# Patient Record
Sex: Male | Born: 1955 | Race: White | Hispanic: No | Marital: Married | Smoking: Former smoker
Health system: Southern US, Community
[De-identification: ages and names within clinical notes are randomized; demographics above are authoritative.]

## PROBLEM LIST (undated history)

## (undated) DIAGNOSIS — N189 Chronic kidney disease, unspecified: Secondary | ICD-10-CM

## (undated) DIAGNOSIS — I10 Essential (primary) hypertension: Secondary | ICD-10-CM

## (undated) DIAGNOSIS — E785 Hyperlipidemia, unspecified: Secondary | ICD-10-CM

## (undated) DIAGNOSIS — E119 Type 2 diabetes mellitus without complications: Secondary | ICD-10-CM

## (undated) DIAGNOSIS — M1990 Unspecified osteoarthritis, unspecified site: Secondary | ICD-10-CM

## (undated) HISTORY — DX: Essential (primary) hypertension: I10

## (undated) HISTORY — PX: APPENDECTOMY: SHX54

## (undated) HISTORY — PX: COLONOSCOPY: SHX174

## (undated) HISTORY — DX: Type 2 diabetes mellitus without complications: E11.9

## (undated) HISTORY — PX: TRIGGER FINGER RELEASE: SHX641

---

## 2005-04-29 ENCOUNTER — Ambulatory Visit: Payer: Self-pay | Admitting: Family Medicine

## 2011-03-08 ENCOUNTER — Ambulatory Visit: Payer: Self-pay | Admitting: Family Medicine

## 2012-05-24 ENCOUNTER — Ambulatory Visit: Payer: Self-pay | Admitting: Family Medicine

## 2012-05-31 ENCOUNTER — Ambulatory Visit: Payer: Self-pay | Admitting: Family Medicine

## 2013-09-03 DIAGNOSIS — E785 Hyperlipidemia, unspecified: Secondary | ICD-10-CM | POA: Insufficient documentation

## 2014-03-15 IMAGING — US US SOFT TISSUE EXCLUDE HEAD/NECK
1 series · 14 of 25 positions shown · non-contrast
Comparison: none

REASON FOR EXAM: Small knot in umbilicus and splenomegaly ATTN SPLEEN
COMMENTS:

[Series 1: us soft tissue exclude head/neck · 0.08mm/px · 14 of 27 slices shown]
[im 1/27]
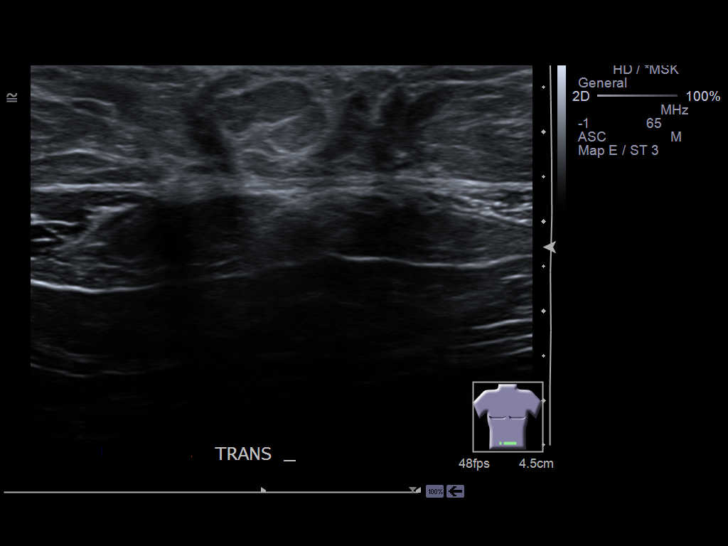
[im 3/27]
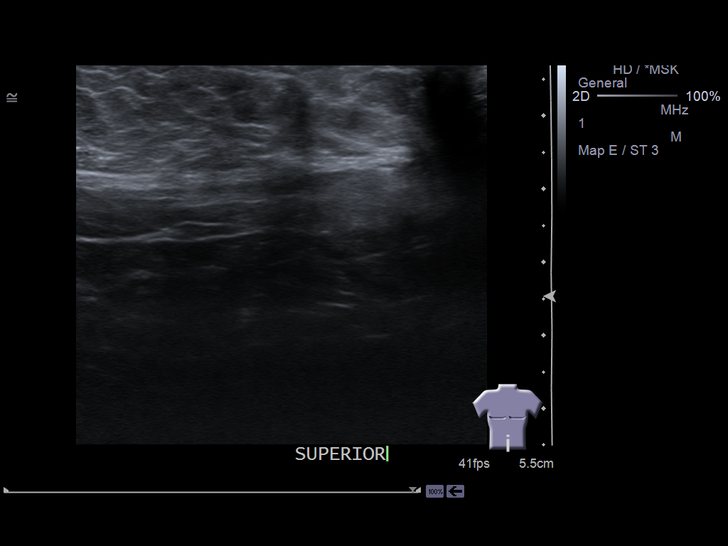
[im 5/27]
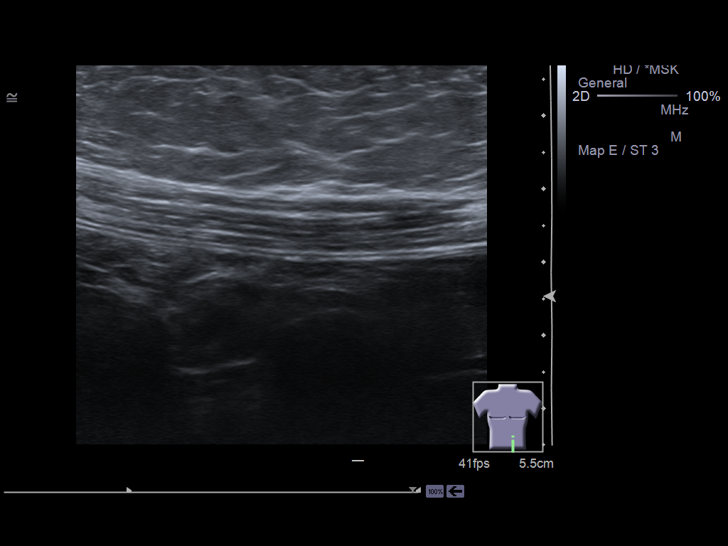
[im 7/27]
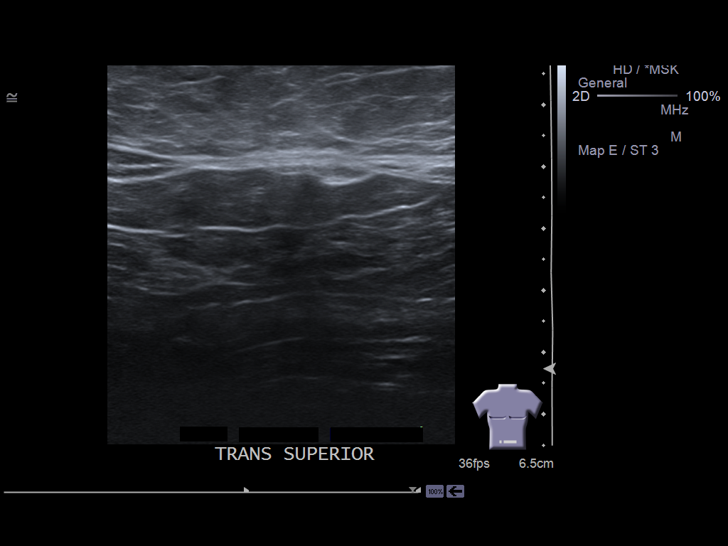
[im 9/27]
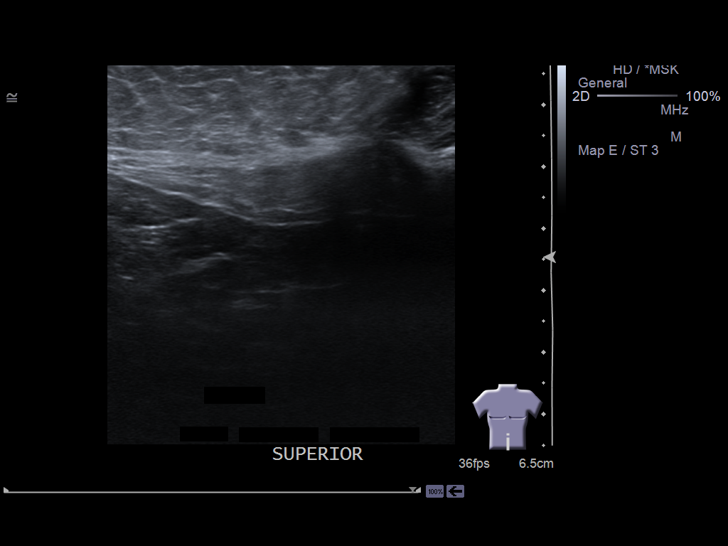
[im 10/27]
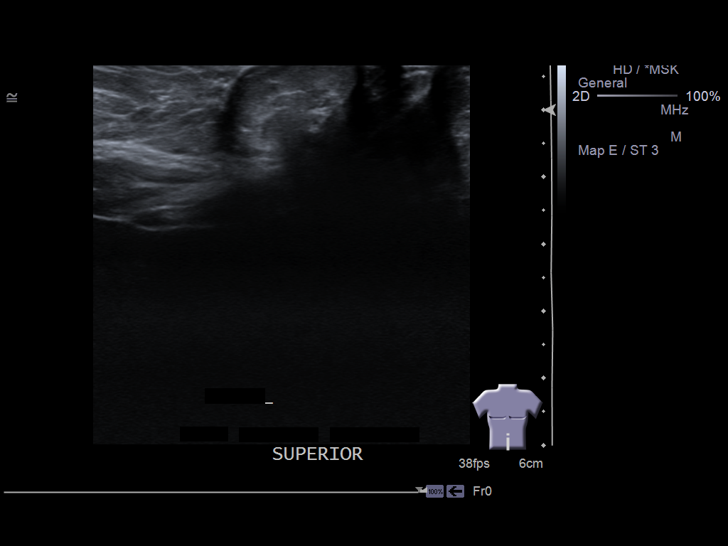
[im 12/27]
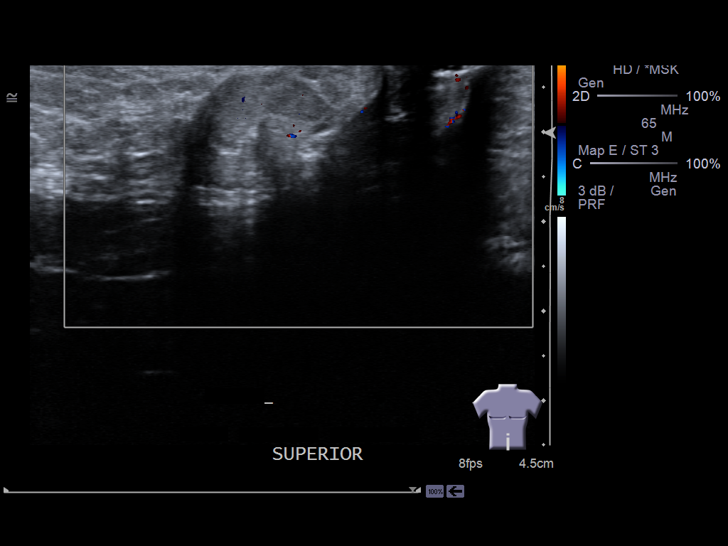
[im 15/27]
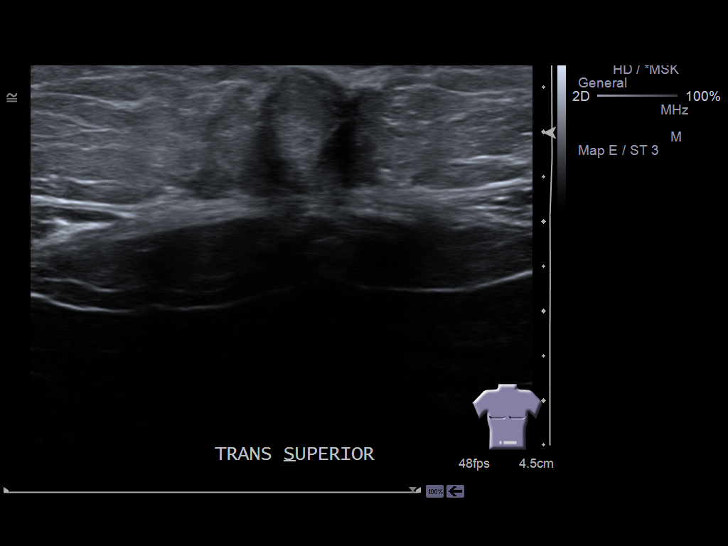
[im 17/27]
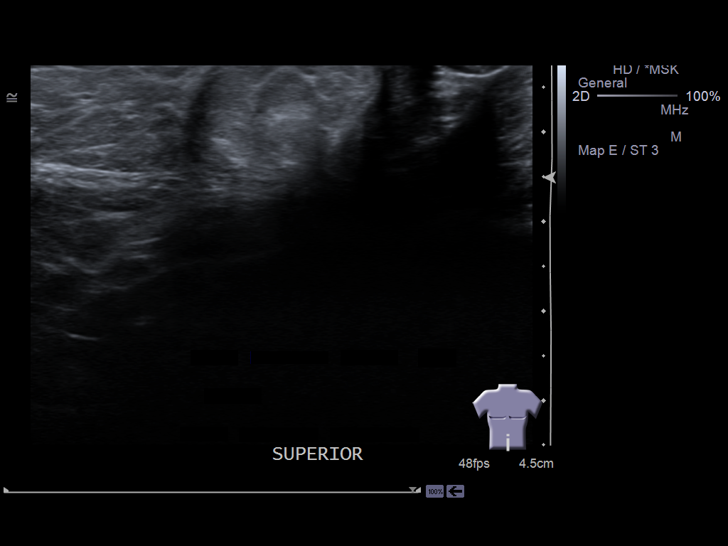
[im 18/27]
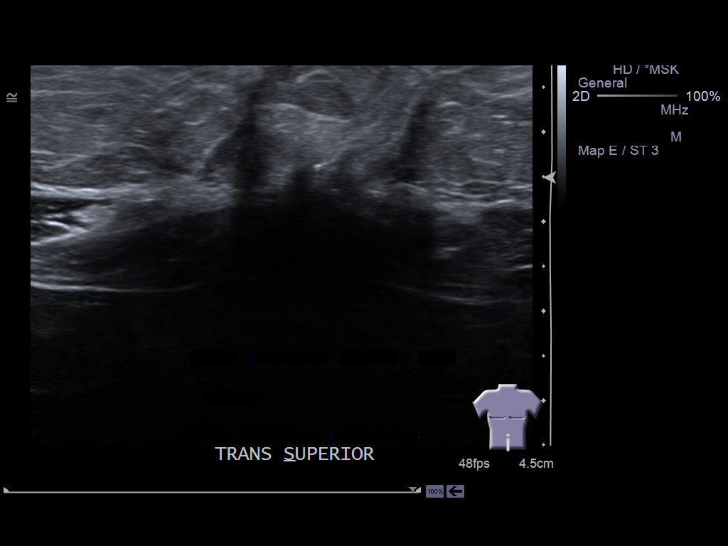
[im 20/27]
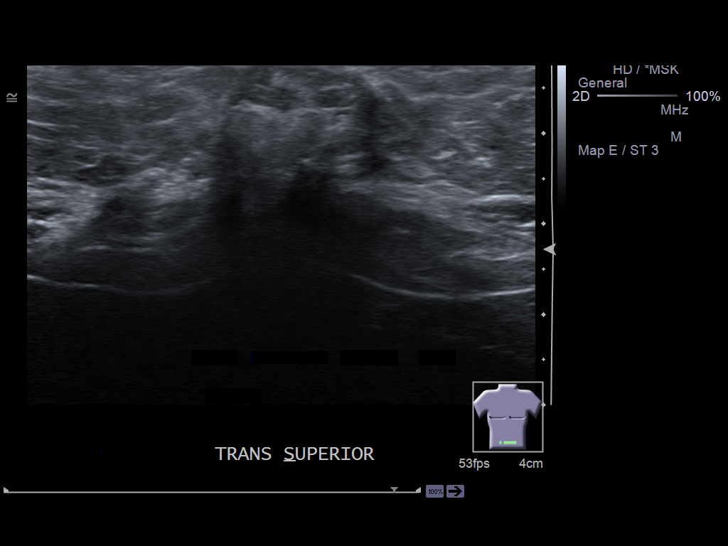
[im 22/27]
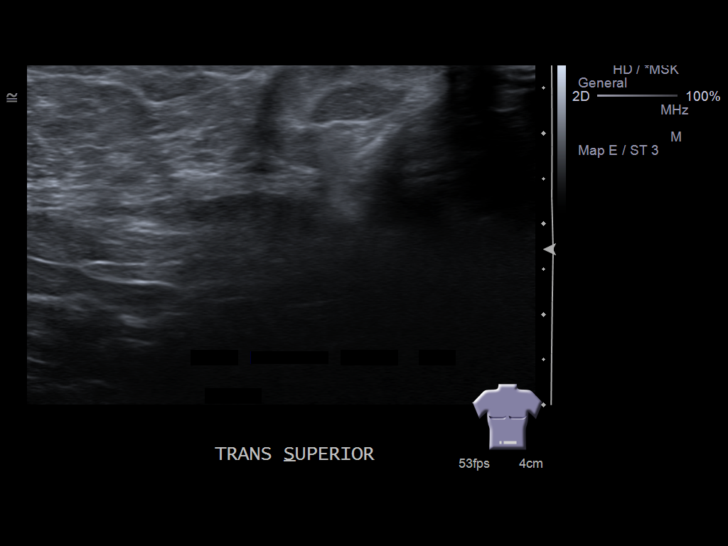
[im 24/27]
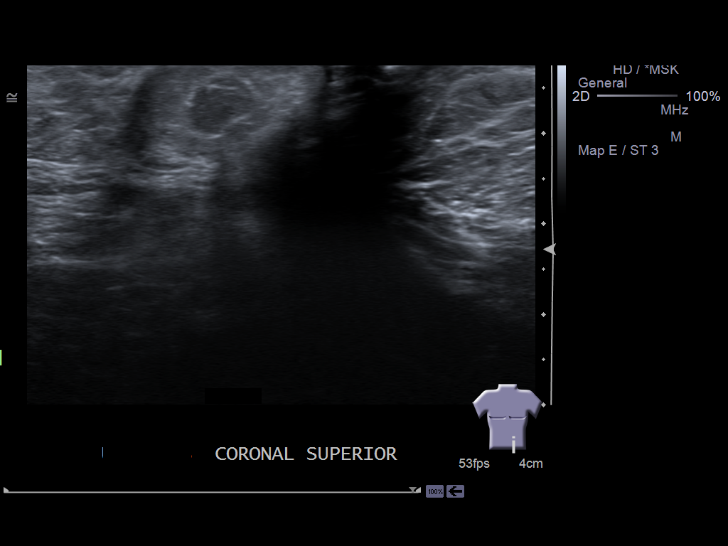
[im 27/27]
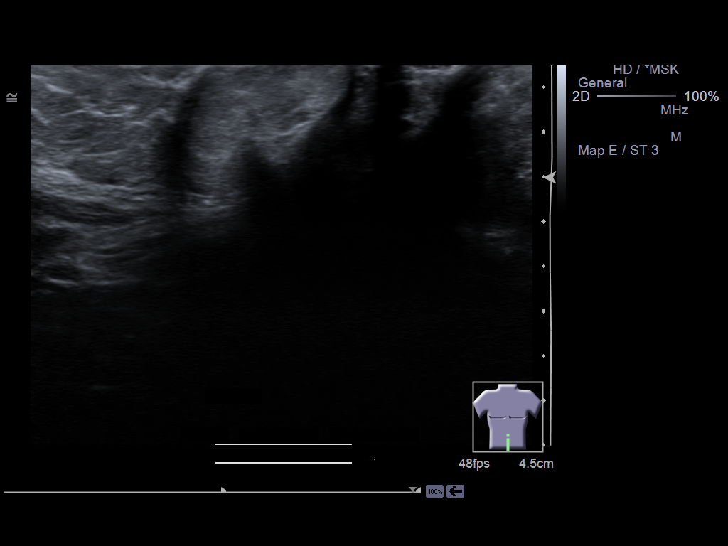

[14 of 25 positions shown; findings below may reference images not displayed]

PROCEDURE:     KATRAGADDA - KATRAGADDA SOFT TISSUE NOT HEAD/NECK  - May 24, 2012 [DATE]

RESULT:     Ultrasound was performed directed to the periumbilical region
where a tiny palpable nodule is demonstrated.

There is a small area of slightly hypoechoic echotexture adjacent to the
umbilicus measuring 2.2 x 1.8 x 2.4 cm. This may reflect a hernia but it is
nondiagnostic on the study.
IMPRESSION: There may be a periumbilical hernia. Further evaluation
with abdominal CT scanning is recommended.

[REDACTED]

## 2014-03-22 IMAGING — CT CT ABDOMEN W/ CM
1 of 2 series · 14 of 32 positions shown, 19 images · IV contrast (isovue)
Comparison: none

REASON FOR EXAM: abd pain periumbilical    diabetic
COMMENTS:

PROCEDURE:     KCT - KCT ABDOMEN STANDARD W  - May 31, 2012  [DATE]
RESULT:     Comparison: None
TECHNIQUE: Multiple axial images of the abdomen were performed from the lung
bases to the iliac crests, with p.o. contrast and with 100 mL of Isovue 370
intravenous contrast.

[Series 2: abd 3mm w 3.0 i40f 3 · axial · 0.92mm/px · z∈[-331,-61]mm · 14 of 100 slices shown, 19 images]
[im 5/100  soft-tissue]
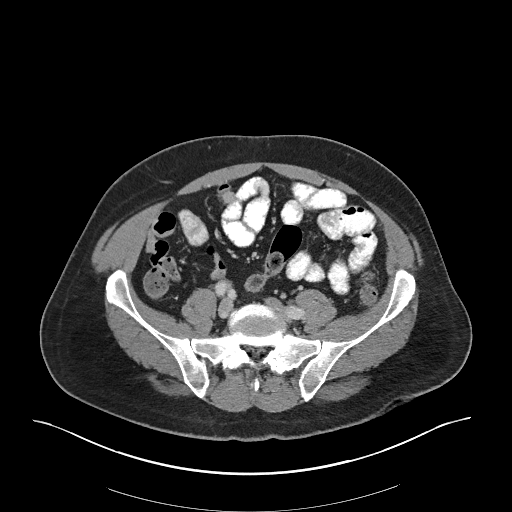
[im 5/100  bone]
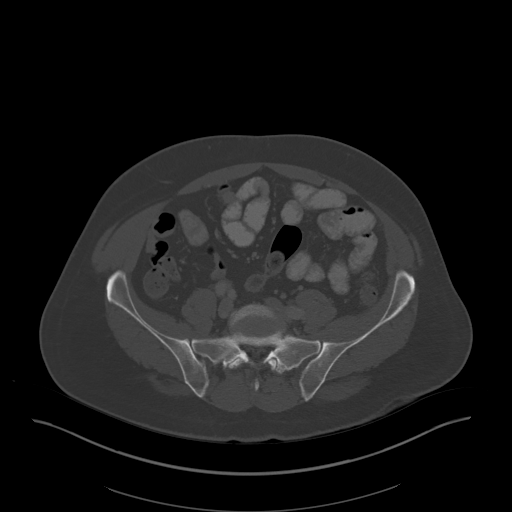
[im 15/100  soft-tissue]
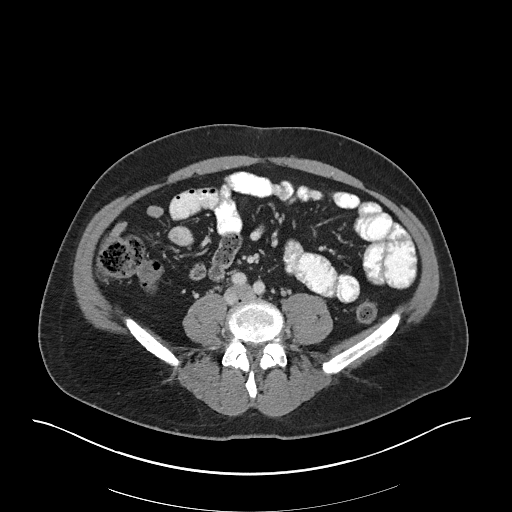
[im 19/100  soft-tissue]
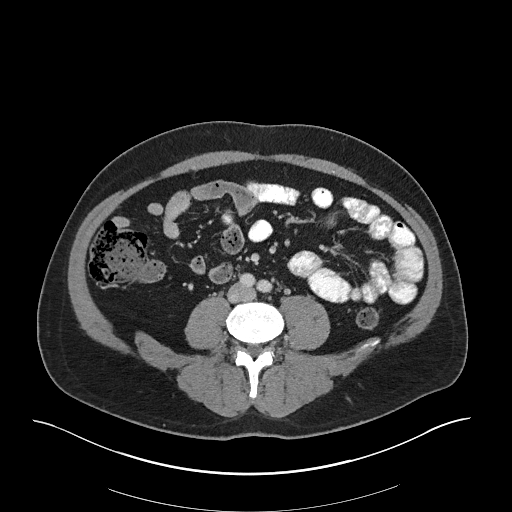
[im 29/100  soft-tissue]
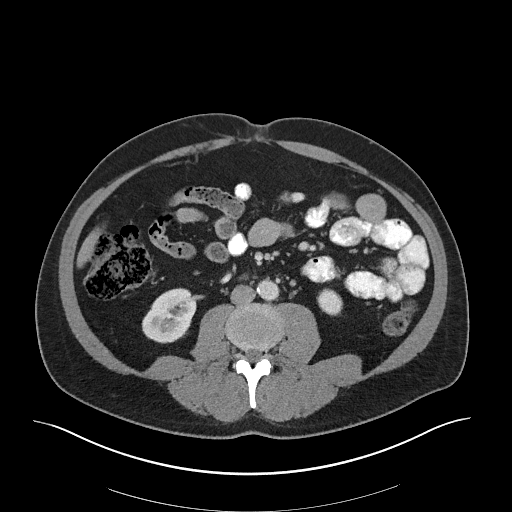
[im 34/100  soft-tissue]
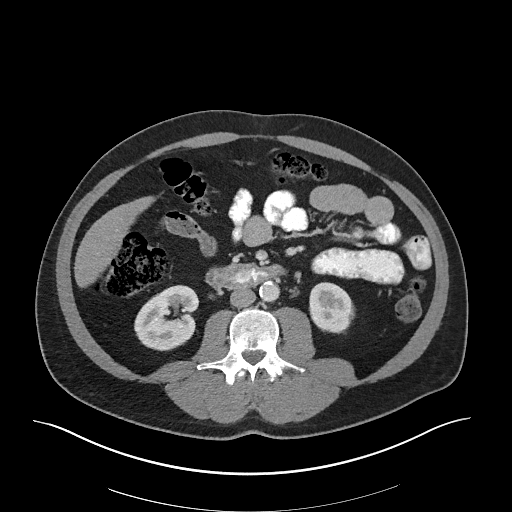
[im 43/100  soft-tissue]
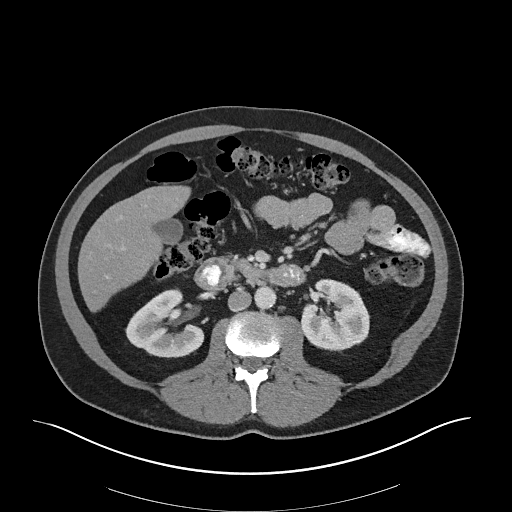
[im 52/100  soft-tissue]
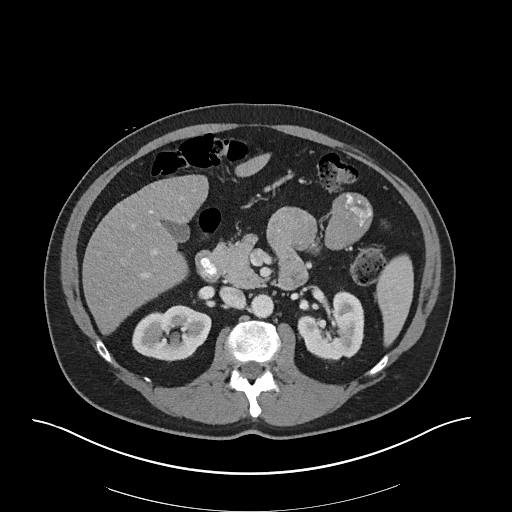
[im 57/100  soft-tissue]
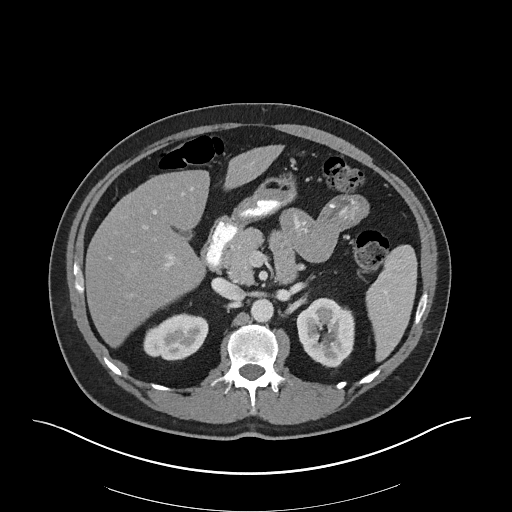
[im 67/100  soft-tissue]
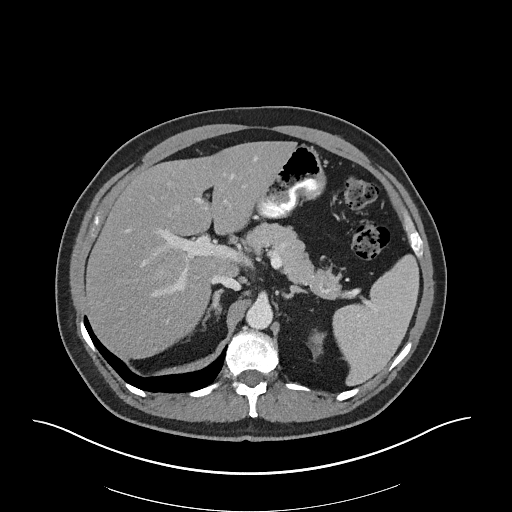
[im 67/100  bone]
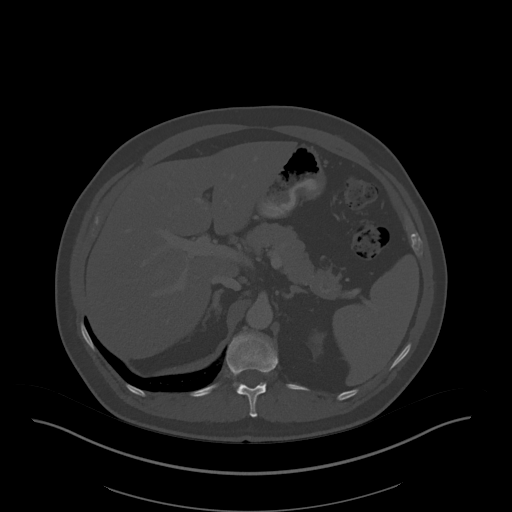
[im 71/100  soft-tissue]
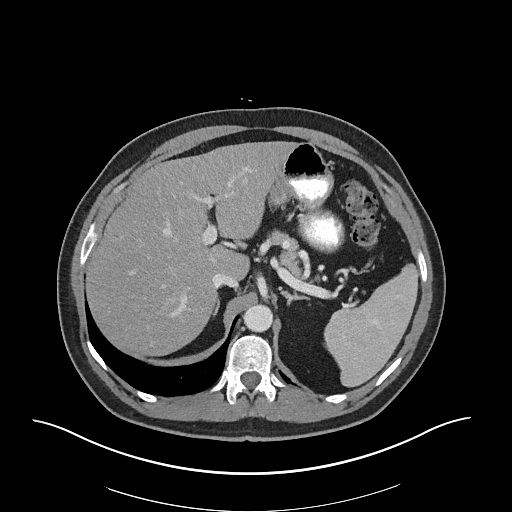
[im 81/100  soft-tissue]
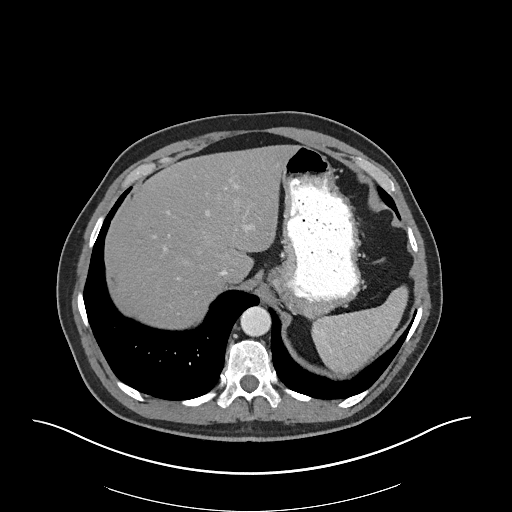
[im 81/100  lung]
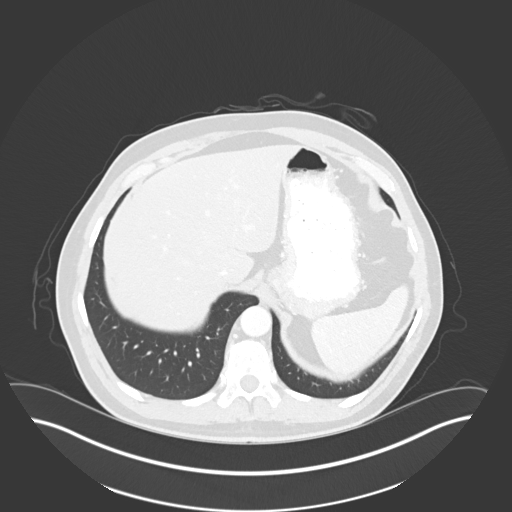
[im 85/100  soft-tissue]
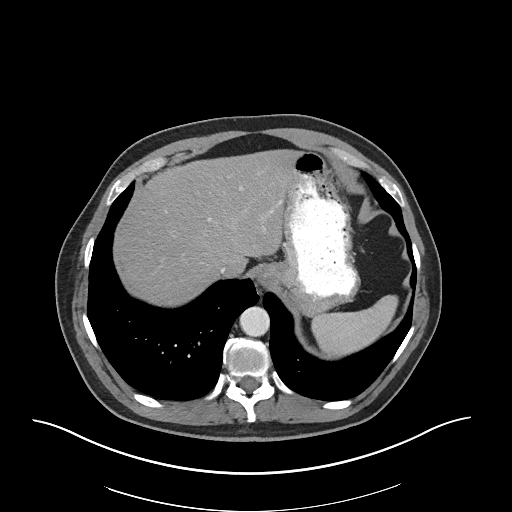
[im 85/100  lung]
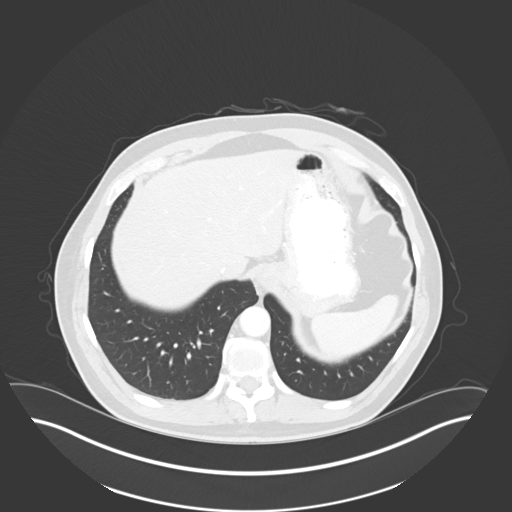
[im 90/100  lung]
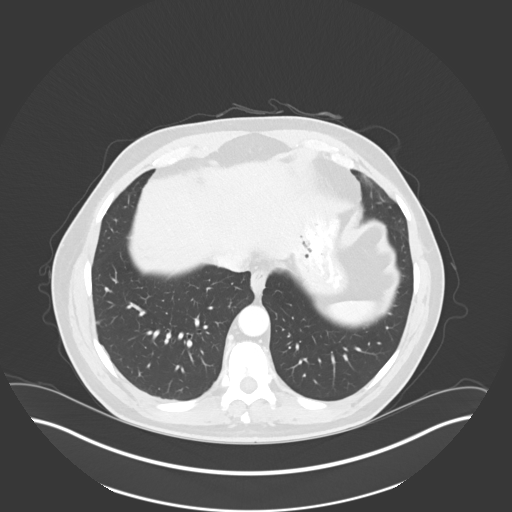
[im 95/100  soft-tissue]
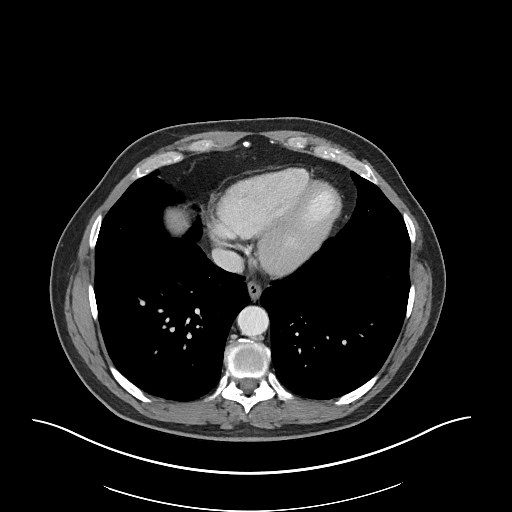
[im 95/100  lung]
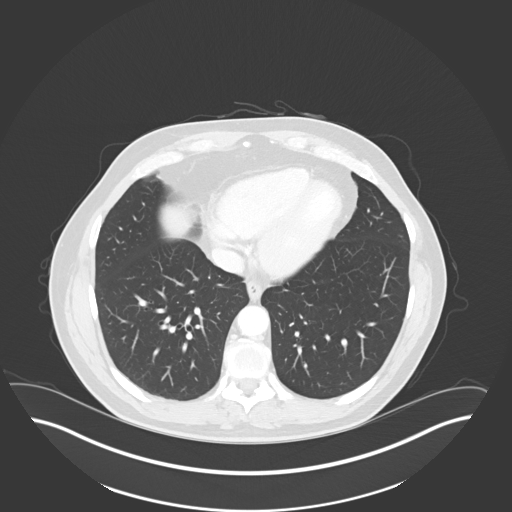

[14 of 32 positions shown; findings below may reference images not displayed]

FINDINGS: There is a 4 mm subpleural nodule in the right lower lobe. Small round
infrahilar density at the superior aspect of the study measures water
density and likely related to the pericardial recess about the inferior
pulmonary veins, incompletely visualized.

The liver, spleen, adrenals, and pancreas are unremarkable. Gallstones are
seen within the gallbladder. The kidneys enhance normally. The visualized
small and large bowel are normal in caliber. There is a small fat-containing
periumbilical hernia.

No aggressive lytic or sclerotic osseous lesions are identified.
IMPRESSION: 1. No acute findings in the abdomen.
2. Cholelithiasis.
2. Indeterminate 4 mm nodule in the right lower lobe. If the patient is at
low risk for lung cancer, no further follow-up is recommended.  If the
patient is at high risk for lung cancer, recommend 12 month follow-up
noncontrast chest CT.

## 2015-03-28 DIAGNOSIS — I10 Essential (primary) hypertension: Secondary | ICD-10-CM | POA: Insufficient documentation

## 2019-05-25 DIAGNOSIS — N289 Disorder of kidney and ureter, unspecified: Secondary | ICD-10-CM | POA: Insufficient documentation

## 2019-05-25 DIAGNOSIS — K760 Fatty (change of) liver, not elsewhere classified: Secondary | ICD-10-CM | POA: Insufficient documentation

## 2019-07-05 DIAGNOSIS — L719 Rosacea, unspecified: Secondary | ICD-10-CM

## 2019-07-05 HISTORY — DX: Rosacea, unspecified: L71.9

## 2019-10-02 ENCOUNTER — Telehealth: Payer: Self-pay | Admitting: General Surgery

## 2019-10-02 ENCOUNTER — Encounter: Payer: Self-pay | Admitting: General Surgery

## 2019-10-02 ENCOUNTER — Other Ambulatory Visit: Payer: Self-pay

## 2019-10-02 ENCOUNTER — Ambulatory Visit (INDEPENDENT_AMBULATORY_CARE_PROVIDER_SITE_OTHER): Payer: No Typology Code available for payment source | Admitting: General Surgery

## 2019-10-02 VITALS — BP 178/98 | HR 59 | Temp 98.5°F | Resp 14 | Ht 70.0 in | Wt 195.2 lb

## 2019-10-02 DIAGNOSIS — K429 Umbilical hernia without obstruction or gangrene: Secondary | ICD-10-CM | POA: Diagnosis not present

## 2019-10-02 DIAGNOSIS — R519 Headache, unspecified: Secondary | ICD-10-CM | POA: Insufficient documentation

## 2019-10-02 DIAGNOSIS — K649 Unspecified hemorrhoids: Secondary | ICD-10-CM | POA: Insufficient documentation

## 2019-10-02 NOTE — Telephone Encounter (Signed)
Left message for patient to call so that we can discuss some surgical dates.

## 2019-10-02 NOTE — Patient Instructions (Signed)
Our surgery scheduler will call you within 24-48 hours to schedule your surgery. Please have the Blue surgery sheet available when speaking with her.   Umbilical Hernia, Adult  A hernia is a bulge of tissue that pushes through an opening between muscles. An umbilical hernia happens in the abdomen, near the belly button (umbilicus). The hernia may contain tissues from the small intestine, large intestine, or fatty tissue covering the intestines (omentum). Umbilical hernias in adults tend to get worse over time, and they require surgical treatment. There are several types of umbilical hernias. You may have:  A hernia located just above or below the umbilicus (indirect hernia). This is the most common type of umbilical hernia in adults.  A hernia that forms through an opening formed by the umbilicus (direct hernia).  A hernia that comes and goes (reducible hernia). A reducible hernia may be visible only when you strain, lift something heavy, or cough. This type of hernia can be pushed back into the abdomen (reduced).  A hernia that traps abdominal tissue inside the hernia (incarcerated hernia). This type of hernia cannot be reduced.  A hernia that cuts off blood flow to the tissues inside the hernia (strangulated hernia). The tissues can start to die if this happens. This type of hernia requires emergency treatment. What are the causes? An umbilical hernia happens when tissue inside the abdomen presses on a weak area of the abdominal muscles. What increases the risk? You may have a greater risk of this condition if you:  Are obese.  Have had several pregnancies.  Have a buildup of fluid inside your abdomen (ascites).  Have had surgery that weakens the abdominal muscles. What are the signs or symptoms? The main symptom of this condition is a painless bulge at or near the belly button. A reducible hernia may be visible only when you strain, lift something heavy, or cough. Other symptoms may  include:  Dull pain.  A feeling of pressure. Symptoms of a strangulated hernia may include:  Pain that gets increasingly worse.  Nausea and vomiting.  Pain when pressing on the hernia.  Skin over the hernia becoming red or purple.  Constipation.  Blood in the stool. How is this diagnosed? This condition may be diagnosed based on:  A physical exam. You may be asked to cough or strain while standing. These actions increase the pressure inside your abdomen and force the hernia through the opening in your muscles. Your health care provider may try to reduce the hernia by pressing on it.  Your symptoms and medical history. How is this treated? Surgery is the only treatment for an umbilical hernia. Surgery for a strangulated hernia is done as soon as possible. If you have a small hernia that is not incarcerated, you may need to lose weight before having surgery. Follow these instructions at home:  Lose weight, if told by your health care provider.  Do not try to push the hernia back in.  Watch your hernia for any changes in color or size. Tell your health care provider if any changes occur.  You may need to avoid activities that increase pressure on your hernia.  Do not lift anything that is heavier than 10 lb (4.5 kg) until your health care provider says that this is safe.  Take over-the-counter and prescription medicines only as told by your health care provider.  Keep all follow-up visits as told by your health care provider. This is important. Contact a health care provider if:    Your hernia gets larger.  Your hernia becomes painful. Get help right away if:  You develop sudden, severe pain near the area of your hernia.  You have pain as well as nausea or vomiting.  You have pain and the skin over your hernia changes color.  You develop a fever. This information is not intended to replace advice given to you by your health care provider. Make sure you discuss any  questions you have with your health care provider. Document Revised: 02/02/2017 Document Reviewed: 06/21/2016 Elsevier Patient Education  2020 [employer]  

## 2019-10-02 NOTE — Progress Notes (Signed)
Patient ID: Brandon Fry, male   DOB: [birth year], 64 y.o.   MRN: [id number]  Chief Complaint  Patient presents with  . New Patient (Initial Visit)    umbilical hernia    HPI Brandon Fry is a 64 y.o. male.   He has been referred by his primary care provider, Dr. Estelle Grumbles, for evaluation of an umbilical hernia.  Brandon Fry states that he has noticed a bulge in his umbilicus for several years, but only recently he began to experience some discomfort.  He says that it is not particularly painful, just uncomfortable.  It is worse as he progresses throughout the day and seems to resolve overnight.  The discomfort is localized to the umbilicus and does not radiate anywhere.  He denies any nausea or vomiting.  No constipation, obstipation, or other symptoms concerning for potential obstruction or bowel involvement.  Due to the discomfort, he is interested in surgical repair.  He has a history of open appendectomy as a child for what he describes as "almost perforated" appendicitis.  This was performed through a lower midline incision, rather than a typical McBurney's.   Past Medical History:  Diagnosis Date  . Diabetes mellitus without complication (HCC)   . Hypertension     Past Surgical History:  Procedure Laterality Date  . APPENDECTOMY      Family History  Problem Relation Age of Onset  . Pulmonary embolism Father     Social History Social History   Tobacco Use  . Smoking status: Former Smoker    Quit date: 1990    Years since quitting: 31.7  . Smokeless tobacco: Never Used  Substance Use Topics  . Alcohol use: Not on file    Comment: a few drinks  . Drug use: Never    Allergies  Allergen Reactions  . Sulfa Antibiotics Rash    Current Outpatient Medications  Medication Sig Dispense Refill  . Dulaglutide (TRULICITY) 1.5 MG/0.5ML SOPN Inject into the skin.    Marland Kitchen ezetimibe-simvastatin (VYTORIN) 10-10 MG tablet Take 1 tablet by mouth at bedtime.    Marland Kitchen  glipizide-metformin (METAGLIP) 2.5-250 MG tablet Take 1 tablet by mouth 2 (two) times daily before a meal.    . insulin NPH Human (NOVOLIN N) 100 UNIT/ML injection Inject into the skin.    Marland Kitchen lisinopril (ZESTRIL) 10 MG tablet Take 10 mg by mouth daily.    . meloxicam (MOBIC) 15 MG tablet Take 15 mg by mouth daily.     No current facility-administered medications for this visit.    Review of Systems Review of Systems  All other systems reviewed and are negative. Or as discussed in the history of present illness.  Blood pressure (!) 178/98, pulse (!) 59, temperature 98.5 F (36.9 C), temperature source Oral, resp. rate 14, height 5\' 10"  (1.778 m), weight 195 lb 3.2 oz (88.5 kg), SpO2 97 %. Body mass index is 28.01 kg/m.  Physical Exam Physical Exam Constitutional:      General: He is not in acute distress.    Appearance: Normal appearance. He is normal weight.  HENT:     Head: Normocephalic and atraumatic.     Nose:     Comments: Covered with a mask    Mouth/Throat:     Comments: Covered with a mask Eyes:     General: No scleral icterus.       Right eye: No discharge.        Left eye: No discharge.  Comments: Wearing glasses  Neck:     Comments: No palpable cervical or supraclavicular lymphadenopathy.  The trachea is midline.  There are no palpable thyroid masses or thyromegaly appreciated. Cardiovascular:     Rate and Rhythm: Regular rhythm. Bradycardia present.     Pulses: Normal pulses.  Pulmonary:     Effort: Pulmonary effort is normal.     Breath sounds: Normal breath sounds.  Abdominal:     General: Bowel sounds are normal.     Palpations: Abdomen is soft.     Hernia: A hernia is present.     Comments: There is a very small hernia identified at the base of the umbilicus.  It is best appreciated with Valsalva.  He has a vertical midline incision in the lower abdomen from his prior appendectomy, but the hernia does not seem to involve this incision.   Genitourinary:    Comments: Deferred Musculoskeletal:        General: No swelling or tenderness.  Skin:    General: Skin is warm and dry.  Neurological:     General: No focal deficit present.     Mental Status: He is alert and oriented to person, place, and time.  Psychiatric:        Mood and Affect: Mood normal.        Behavior: Behavior normal.     Data Reviewed I reviewed the clinic notes from his primary care provider dated July 30, 2019.  At that time, Dr. Derrill Kay noted a tender area at the umbilicus and raise the question as to the possibility that this might be a hernia.  I also reviewed these records to confirm the dose of Trulicity that was prescribed at that time.  Brandon Fry states that this may have changed; the dose reported in his medication list is that which is documented on the July 30, 2019 clinic note.  I also reviewed labs from July 2021, also obtained via the electronic medical record.  Pertinent to our discussion is his hemoglobin A1c, which is acceptable at 6.5.  Assessment This is a 64 year old man with a small umbilical hernia.  It is becoming increasingly symptomatic and he is interested in surgical repair.  Plan I have offered him an umbilical hernia repair.  I discussed the risks of the operation with him.  These include, but are not limited to, bleeding, infection, injury to surrounding tissues or structures, potential need for mesh implantation, complications related to mesh, hernia recurrence, need for unanticipated additional procedures or operations, as well as the risks of anesthesia.  We will work on getting him scheduled.    Duanne Guess 10/02/2019, 9:31 AM

## 2019-10-02 NOTE — H&P (View-Only) (Signed)
Patient ID: Brandon Fry, male   DOB: [birth year], 64 y.o.   MRN: [id number]  Chief Complaint  Patient presents with  . New Patient (Initial Visit)    umbilical hernia    HPI Brandon Fry is a 64 y.o. male.   He has been referred by his primary care provider, Dr. Estelle Grumbles, for evaluation of an umbilical hernia.  [name] states that he has noticed a bulge in his umbilicus for several years, but only recently he began to experience some discomfort.  He says that it is not particularly painful, just uncomfortable.  It is worse as he progresses throughout the day and seems to resolve overnight.  The discomfort is localized to the umbilicus and does not radiate anywhere.  He denies any nausea or vomiting.  No constipation, obstipation, or other symptoms concerning for potential obstruction or bowel involvement.  Due to the discomfort, he is interested in surgical repair.  He has a history of open appendectomy as a child for what he describes as "almost perforated" appendicitis.  This was performed through a lower midline incision, rather than a typical McBurney's.   Past Medical History:  Diagnosis Date  . Diabetes mellitus without complication (HCC)   . Hypertension     Past Surgical History:  Procedure Laterality Date  . APPENDECTOMY      Family History  Problem Relation Age of Onset  . Pulmonary embolism Father     Social History Social History   Tobacco Use  . Smoking status: Former Smoker    Quit date: 1990    Years since quitting: 31.7  . Smokeless tobacco: Never Used  Substance Use Topics  . Alcohol use: Not on file    Comment: a few drinks  . Drug use: Never    Allergies  Allergen Reactions  . Sulfa Antibiotics Rash    Current Outpatient Medications  Medication Sig Dispense Refill  . Dulaglutide (TRULICITY) 1.5 MG/0.5ML SOPN Inject into the skin.    Marland Kitchen ezetimibe-simvastatin (VYTORIN) 10-10 MG tablet Take 1 tablet by mouth at bedtime.    Marland Kitchen  glipizide-metformin (METAGLIP) 2.5-250 MG tablet Take 1 tablet by mouth 2 (two) times daily before a meal.    . insulin NPH Human (NOVOLIN N) 100 UNIT/ML injection Inject into the skin.    Marland Kitchen lisinopril (ZESTRIL) 10 MG tablet Take 10 mg by mouth daily.    . meloxicam (MOBIC) 15 MG tablet Take 15 mg by mouth daily.     No current facility-administered medications for this visit.    Review of Systems Review of Systems  All other systems reviewed and are negative. Or as discussed in the history of present illness.  Blood pressure (!) 178/98, pulse (!) 59, temperature 98.5 F (36.9 C), temperature source Oral, resp. rate 14, height 5\' 10"  (1.778 m), weight 195 lb 3.2 oz (88.5 kg), SpO2 97 %. Body mass index is 28.01 kg/m.  Physical Exam Physical Exam Constitutional:      General: He is not in acute distress.    Appearance: Normal appearance. He is normal weight.  HENT:     Head: Normocephalic and atraumatic.     Nose:     Comments: Covered with a mask    Mouth/Throat:     Comments: Covered with a mask Eyes:     General: No scleral icterus.       Right eye: No discharge.        Left eye: No discharge.  Comments: Wearing glasses  Neck:     Comments: No palpable cervical or supraclavicular lymphadenopathy.  The trachea is midline.  There are no palpable thyroid masses or thyromegaly appreciated. Cardiovascular:     Rate and Rhythm: Regular rhythm. Bradycardia present.     Pulses: Normal pulses.  Pulmonary:     Effort: Pulmonary effort is normal.     Breath sounds: Normal breath sounds.  Abdominal:     General: Bowel sounds are normal.     Palpations: Abdomen is soft.     Hernia: A hernia is present.     Comments: There is a very small hernia identified at the base of the umbilicus.  It is best appreciated with Valsalva.  He has a vertical midline incision in the lower abdomen from his prior appendectomy, but the hernia does not seem to involve this incision.   Genitourinary:    Comments: Deferred Musculoskeletal:        General: No swelling or tenderness.  Skin:    General: Skin is warm and dry.  Neurological:     General: No focal deficit present.     Mental Status: He is alert and oriented to person, place, and time.  Psychiatric:        Mood and Affect: Mood normal.        Behavior: Behavior normal.     Data Reviewed I reviewed the clinic notes from his primary care provider dated July 30, 2019.  At that time, Dr. Brownstein noted a tender area at the umbilicus and raise the question as to the possibility that this might be a hernia.  I also reviewed these records to confirm the dose of Trulicity that was prescribed at that time.  Mr. Marple states that this may have changed; the dose reported in his medication list is that which is documented on the July 30, 2019 clinic note.  I also reviewed labs from July 2021, also obtained via the electronic medical record.  Pertinent to our discussion is his hemoglobin A1c, which is acceptable at 6.5.  Assessment This is a 64-year-old man with a small umbilical hernia.  It is becoming increasingly symptomatic and he is interested in surgical repair.  Plan I have offered him an umbilical hernia repair.  I discussed the risks of the operation with him.  These include, but are not limited to, bleeding, infection, injury to surrounding tissues or structures, potential need for mesh implantation, complications related to mesh, hernia recurrence, need for unanticipated additional procedures or operations, as well as the risks of anesthesia.  We will work on getting him scheduled.    Desjuan Stearns 10/02/2019, 9:31 AM   

## 2019-10-03 ENCOUNTER — Telehealth: Payer: Self-pay | Admitting: General Surgery

## 2019-10-03 NOTE — Telephone Encounter (Signed)
Patient has been advised of Pre-Admission date/time, COVID Testing date and Surgery date.  Surgery Date: 10/26/19 Preadmission Testing Date: 10/12/19 (phone 8a-1p) Covid Testing Date: 10/24/19 - patient advised to [facility] ([address] [facility]) between 8a-1p   Patient has been made aware to call [phone], between 1-3:00pm the day before surgery, to find out what time to arrive for surgery.

## 2019-10-08 ENCOUNTER — Ambulatory Visit: Payer: Self-pay | Admitting: Dermatology

## 2019-10-12 ENCOUNTER — Inpatient Hospital Stay
Admission: RE | Admit: 2019-10-12 | Discharge: 2019-10-12 | Disposition: A | Discharge status: Home / Self Care | Payer: Self-pay | Source: Ambulatory Visit

## 2019-10-12 NOTE — Pre-Procedure Instructions (Signed)
Attempt x1 to contact pt for PAT interview. Generic msg left.

## 2019-10-17 ENCOUNTER — Encounter
Admission: RE | Admit: 2019-10-17 | Discharge: 2019-10-17 | Disposition: A | Discharge status: Home / Self Care | Payer: PRIVATE HEALTH INSURANCE | Source: Ambulatory Visit | Attending: General Surgery | Admitting: General Surgery

## 2019-10-17 DIAGNOSIS — I10 Essential (primary) hypertension: Secondary | ICD-10-CM | POA: Diagnosis not present

## 2019-10-17 DIAGNOSIS — Z01818 Encounter for other preprocedural examination: Secondary | ICD-10-CM | POA: Diagnosis present

## 2019-10-17 HISTORY — DX: Unspecified osteoarthritis, unspecified site: M19.90

## 2019-10-17 HISTORY — DX: Chronic kidney disease, unspecified: N18.9

## 2019-10-17 HISTORY — DX: Hyperlipidemia, unspecified: E78.5

## 2019-10-17 NOTE — Patient Instructions (Addendum)
Your procedure is scheduled on: 10/26/19 Report to Day Surgery on the 2nd floor of [facility]. To find out your arrival time, please call [phone] between 1PM - 3PM on: 10/25/19  REMEMBER: Instructions that are not followed completely may result in serious medical risk, up to and including death; or upon the discretion of your surgeon and anesthesiologist your surgery may need to be rescheduled.  Do not eat food after midnight the night before surgery.  No gum chewing, lozengers or hard candies.  You may however, drink CLEAR liquids up to 2 hours before you are scheduled to arrive for your surgery. Do not drink anything within 2 hours of your scheduled arrival time.  Clear liquids include: -Type 1 and Type 2 diabetics should only drink water.  TAKE THESE MEDICATIONS THE MORNING OF SURGERY WITH A SIP OF WATER: 1. Tylenol as needed.  - Do Not take Lisinopril the morning of surgery. - Stop glipizide-metformin (METAGLIP) 2.5-250 MG tablet 3 days prior to surgery, do not take on 10/20/ and 10/21. - Take 1/2 of NPH insulin dose the night before surgery and none on the morning of surgery.  Follow recommendations from Cardiologist, Pulmonologist or PCP regarding stopping Aspirin, Coumadin, Plavix, Eliquis, Pradaxa, or Pletal.  One week prior to surgery: Stop Anti-inflammatories (NSAIDS) such as Advil, Aleve, Ibuprofen, Motrin, MOBIC, Naproxen, Naprosyn and Aspirin based products such as Excedrin, Goodys Powder, BC Powder.  Stop ANY OVER THE COUNTER supplements until after surgery. (You may continue taking Tylenol, Vitamin D, Vitamin B, and multivitamin.)  No Alcohol for 24 hours before or after surgery.  No Smoking including e-cigarettes for 24 hours prior to surgery.  No chewable tobacco products for at least 6 hours prior to surgery.  No nicotine patches on the day of surgery.  Do not use any "recreational" drugs for at least a week prior to your surgery.  Please be advised  that the combination of cocaine and anesthesia may have negative outcomes, up to and including death. If you test positive for cocaine, your surgery will be cancelled.  On the morning of surgery brush your teeth with toothpaste and water, you may rinse your mouth with mouthwash if you wish. Do not swallow any toothpaste or mouthwash.  Do not wear jewelry, make-up, hairpins, clips or nail polish.  Do not wear lotions, powders, or perfumes.   Do not shave 48 hours prior to surgery.   Contact lenses, hearing aids and dentures may not be worn into surgery.  Do not bring valuables to the hospital. [facility] is not responsible for any missing/lost belongings or valuables.   Use CHG Soap or wipes as directed on instruction sheet.  Notify your doctor if there is any change in your medical condition (cold, fever, infection).  Wear comfortable clothing (specific to your surgery type) to the hospital.  Plan for stool softeners for home use; pain medications have a tendency to cause constipation. You can also help prevent constipation by eating foods high in fiber such as fruits and vegetables and drinking plenty of fluids as your diet allows.  After surgery, you can help prevent lung complications by doing breathing exercises.  Take deep breaths and cough every 1-2 hours. Your doctor may order a device called an Incentive Spirometer to help you take deep breaths. When coughing or sneezing, hold a pillow firmly against your incision with both hands. This is called "splinting." Doing this helps protect your incision. It also decreases belly discomfort.  If you  are being admitted to the hospital overnight, leave your suitcase in the car. After surgery it may be brought to your room.  If you are being discharged the day of surgery, you will not be allowed to drive home. You will need a responsible adult (18 years or older) to drive you home and stay with you that night.   If you are taking  public transportation, you will need to have a responsible adult (18 years or older) with you. Please confirm with your physician that it is acceptable to use public transportation.   Please call the [place] [facility] at [phone] if you have any questions about these instructions.  Visitation Policy:  Patients undergoing a surgery or procedure may have one family member or support person with them as long as that person is not COVID-19 positive or experiencing its symptoms.  That person may remain in the waiting area during the procedure.  Inpatient Visitation Update:   In an effort to ensure the safety of our team members and our patients, we are implementing a change to our visitation policy:  Effective Monday, Aug. 9, at 7 a.m., inpatients will be allowed one support person.  o The support person may change daily.  o The support person must pass our screening, gel in and out, and wear a mask at all times, including in the patient's room.  o Patients must also wear a mask when staff or their support person are in the room.  o Masking is required regardless of vaccination status.  Systemwide, no visitors 17 or younger.

## 2019-10-24 ENCOUNTER — Encounter
Admission: RE | Admit: 2019-10-24 | Discharge: 2019-10-24 | Disposition: A | Discharge status: Home / Self Care | Payer: PRIVATE HEALTH INSURANCE | Source: Ambulatory Visit | Attending: General Surgery | Admitting: General Surgery

## 2019-10-24 ENCOUNTER — Other Ambulatory Visit: Payer: Self-pay

## 2019-10-24 DIAGNOSIS — I10 Essential (primary) hypertension: Secondary | ICD-10-CM | POA: Insufficient documentation

## 2019-10-24 DIAGNOSIS — Z01818 Encounter for other preprocedural examination: Secondary | ICD-10-CM | POA: Insufficient documentation

## 2019-10-24 DIAGNOSIS — Z20822 Contact with and (suspected) exposure to covid-19: Secondary | ICD-10-CM | POA: Diagnosis not present

## 2019-10-24 LAB — CBC
HCT: 38.5 % — ABNORMAL LOW (ref 39.0–52.0)
Hemoglobin: 13.4 g/dL (ref 13.0–17.0)
MCH: 28.9 pg (ref 26.0–34.0)
MCHC: 34.8 g/dL (ref 30.0–36.0)
MCV: 83.2 fL (ref 80.0–100.0)
Platelets: 289 10*3/uL (ref 150–400)
RBC: 4.63 MIL/uL (ref 4.22–5.81)
RDW: 12.8 % (ref 11.5–15.5)
WBC: 8.1 10*3/uL (ref 4.0–10.5)
nRBC: 0 % (ref 0.0–0.2)

## 2019-10-24 LAB — BASIC METABOLIC PANEL
Anion gap: 10 (ref 5–15)
BUN: 22 mg/dL (ref 8–23)
CO2: 24 mmol/L (ref 22–32)
Calcium: 9.6 mg/dL (ref 8.9–10.3)
Chloride: 105 mmol/L (ref 98–111)
Creatinine, Ser: 1.01 mg/dL (ref 0.61–1.24)
GFR, Estimated: >60 mL/min (ref >60)
Glucose, Bld: 149 mg/dL — ABNORMAL HIGH (ref 70–99)
Potassium: 4.4 mmol/L (ref 3.5–5.1)
Sodium: 139 mmol/L (ref 135–145)

## 2019-10-24 LAB — PROTIME-INR
INR: 1 (ref 0.8–1.2)
Prothrombin Time: 12.3 seconds (ref 11.4–15.2)

## 2019-10-24 LAB — APTT: aPTT: 31 seconds (ref 24–36)

## 2019-10-24 LAB — SARS CORONAVIRUS 2 (TAT 6-24 HRS): SARS Coronavirus 2: NEGATIVE

## 2019-10-25 MED ORDER — CHLORHEXIDINE GLUCONATE 0.12 % MT SOLN: 15.0000 mL | Freq: Once | OROMUCOSAL | Status: AC

## 2019-10-25 MED ORDER — ORAL CARE MOUTH RINSE: 15.0000 mL | Freq: Once | OROMUCOSAL | Status: AC

## 2019-10-25 MED ORDER — FAMOTIDINE 20 MG PO TABS: 20.0000 mg | ORAL_TABLET | Freq: Once | ORAL | Status: AC

## 2019-10-25 MED ORDER — GABAPENTIN 300 MG PO CAPS: 300.0000 mg | ORAL_CAPSULE | ORAL | Status: AC

## 2019-10-25 MED ORDER — SODIUM CHLORIDE 0.9 % IV SOLN: INTRAVENOUS | Status: DC

## 2019-10-25 MED ORDER — BUPIVACAINE LIPOSOME 1.3 % IJ SUSP: 20.0000 mL | Freq: Once | INTRAMUSCULAR | Status: DC

## 2019-10-25 MED ORDER — ACETAMINOPHEN 500 MG PO TABS: 1000.0000 mg | ORAL_TABLET | ORAL | Status: AC

## 2019-10-25 MED ORDER — CEFAZOLIN SODIUM-DEXTROSE 2-4 GM/100ML-% IV SOLN
2.0000 g | INTRAVENOUS | Status: AC
  Administered 2019-10-26: 2 g via INTRAVENOUS

## 2019-10-26 ENCOUNTER — Ambulatory Visit: Payer: No Typology Code available for payment source | Admitting: Certified Registered Nurse Anesthetist

## 2019-10-26 ENCOUNTER — Other Ambulatory Visit: Payer: Self-pay

## 2019-10-26 ENCOUNTER — Encounter
Admission: RE | Disposition: A | Discharge status: Home / Self Care | Payer: Self-pay | Source: Home / Self Care | Attending: General Surgery

## 2019-10-26 ENCOUNTER — Encounter: Payer: Self-pay | Admitting: General Surgery

## 2019-10-26 ENCOUNTER — Ambulatory Visit
Admission: RE | Admit: 2019-10-26 | Discharge: 2019-10-26 | Disposition: A | Discharge status: Home / Self Care | Payer: No Typology Code available for payment source | Attending: General Surgery | Admitting: General Surgery

## 2019-10-26 DIAGNOSIS — E119 Type 2 diabetes mellitus without complications: Secondary | ICD-10-CM | POA: Diagnosis not present

## 2019-10-26 DIAGNOSIS — Z882 Allergy status to sulfonamides status: Secondary | ICD-10-CM | POA: Diagnosis not present

## 2019-10-26 DIAGNOSIS — K429 Umbilical hernia without obstruction or gangrene: Secondary | ICD-10-CM | POA: Diagnosis present

## 2019-10-26 DIAGNOSIS — Z87891 Personal history of nicotine dependence: Secondary | ICD-10-CM | POA: Insufficient documentation

## 2019-10-26 DIAGNOSIS — Z8249 Family history of ischemic heart disease and other diseases of the circulatory system: Secondary | ICD-10-CM | POA: Diagnosis not present

## 2019-10-26 DIAGNOSIS — Z79899 Other long term (current) drug therapy: Secondary | ICD-10-CM | POA: Diagnosis not present

## 2019-10-26 DIAGNOSIS — Z791 Long term (current) use of non-steroidal anti-inflammatories (NSAID): Secondary | ICD-10-CM | POA: Insufficient documentation

## 2019-10-26 DIAGNOSIS — K420 Umbilical hernia with obstruction, without gangrene: Secondary | ICD-10-CM | POA: Diagnosis not present

## 2019-10-26 DIAGNOSIS — I10 Essential (primary) hypertension: Secondary | ICD-10-CM | POA: Diagnosis not present

## 2019-10-26 DIAGNOSIS — Z7984 Long term (current) use of oral hypoglycemic drugs: Secondary | ICD-10-CM | POA: Insufficient documentation

## 2019-10-26 HISTORY — PX: UMBILICAL HERNIA REPAIR: SHX196

## 2019-10-26 LAB — GLUCOSE, CAPILLARY
Glucose-Capillary: 158 mg/dL — ABNORMAL HIGH (ref 70–99)
Glucose-Capillary: 175 mg/dL — ABNORMAL HIGH (ref 70–99)

## 2019-10-26 SURGERY — REPAIR, HERNIA, UMBILICAL, ADULT
Anesthesia: General | Site: Abdomen

## 2019-10-26 MED ORDER — CHLORHEXIDINE GLUCONATE CLOTH 2 % EX PADS
6.0000 | MEDICATED_PAD | Freq: Once | CUTANEOUS | Status: AC
  Administered 2019-10-26: 6 via TOPICAL

## 2019-10-26 MED ORDER — FAMOTIDINE 20 MG PO TABS
ORAL_TABLET | ORAL | Status: AC
  Administered 2019-10-26: 20 mg via ORAL
  Filled 2019-10-26: qty 1

## 2019-10-26 MED ORDER — CHLORHEXIDINE GLUCONATE 0.12 % MT SOLN
OROMUCOSAL | Status: AC
  Administered 2019-10-26: 15 mL via OROMUCOSAL
  Filled 2019-10-26: qty 15

## 2019-10-26 MED ORDER — ONDANSETRON HCL 4 MG/2ML IJ SOLN
INTRAMUSCULAR | Status: AC
  Filled 2019-10-26: qty 2

## 2019-10-26 MED ORDER — ACETAMINOPHEN 500 MG PO TABS
ORAL_TABLET | ORAL | Status: AC
  Administered 2019-10-26: 1000 mg via ORAL
  Filled 2019-10-26: qty 2

## 2019-10-26 MED ORDER — LIDOCAINE HCL (CARDIAC) PF 100 MG/5ML IV SOSY
PREFILLED_SYRINGE | INTRAVENOUS | Status: DC | PRN
  Administered 2019-10-26: 100 mg via INTRAVENOUS

## 2019-10-26 MED ORDER — IBUPROFEN 800 MG PO TABS: 800.0000 mg | ORAL_TABLET | Freq: Three times a day (TID) | ORAL | 0 refills | Status: DC | PRN

## 2019-10-26 MED ORDER — LIDOCAINE HCL 4 % MT SOLN
OROMUCOSAL | Status: DC | PRN
  Administered 2019-10-26: 4 mL via TOPICAL

## 2019-10-26 MED ORDER — ONDANSETRON HCL 4 MG/2ML IJ SOLN
INTRAMUSCULAR | Status: DC | PRN
  Administered 2019-10-26: 4 mg via INTRAVENOUS

## 2019-10-26 MED ORDER — DEXAMETHASONE SODIUM PHOSPHATE 10 MG/ML IJ SOLN
INTRAMUSCULAR | Status: AC
  Filled 2019-10-26: qty 1

## 2019-10-26 MED ORDER — GABAPENTIN 300 MG PO CAPS
ORAL_CAPSULE | ORAL | Status: AC
  Administered 2019-10-26: 300 mg via ORAL
  Filled 2019-10-26: qty 1

## 2019-10-26 MED ORDER — FENTANYL CITRATE (PF) 100 MCG/2ML IJ SOLN: 25.0000 ug | INTRAMUSCULAR | Status: DC | PRN

## 2019-10-26 MED ORDER — PROPOFOL 10 MG/ML IV BOLUS
INTRAVENOUS | Status: AC
  Filled 2019-10-26: qty 20

## 2019-10-26 MED ORDER — HYDROCODONE-ACETAMINOPHEN 5-325 MG PO TABS: 1.0000 | ORAL_TABLET | Freq: Four times a day (QID) | ORAL | 0 refills | Status: DC | PRN

## 2019-10-26 MED ORDER — PHENYLEPHRINE HCL (PRESSORS) 10 MG/ML IV SOLN
INTRAVENOUS | Status: DC | PRN
  Administered 2019-10-26 (×4): 100 ug via INTRAVENOUS

## 2019-10-26 MED ORDER — PROPOFOL 10 MG/ML IV BOLUS
INTRAVENOUS | Status: DC | PRN
  Administered 2019-10-26: 160 mg via INTRAVENOUS

## 2019-10-26 MED ORDER — VASOPRESSIN 20 UNIT/ML IV SOLN
INTRAVENOUS | Status: DC | PRN
  Administered 2019-10-26: 2 [IU] via INTRAVENOUS
  Administered 2019-10-26 (×2): 1 [IU] via INTRAVENOUS

## 2019-10-26 MED ORDER — FENTANYL CITRATE (PF) 250 MCG/5ML IJ SOLN
INTRAMUSCULAR | Status: DC | PRN
  Administered 2019-10-26 (×2): 50 ug via INTRAVENOUS

## 2019-10-26 MED ORDER — ROCURONIUM BROMIDE 10 MG/ML (PF) SYRINGE
PREFILLED_SYRINGE | INTRAVENOUS | Status: AC
  Filled 2019-10-26: qty 10

## 2019-10-26 MED ORDER — HYDROCODONE-ACETAMINOPHEN 5-325 MG PO TABS
ORAL_TABLET | ORAL | Status: AC
  Filled 2019-10-26: qty 1

## 2019-10-26 MED ORDER — LIDOCAINE HCL (PF) 2 % IJ SOLN
INTRAMUSCULAR | Status: AC
  Filled 2019-10-26: qty 5

## 2019-10-26 MED ORDER — HYDROCODONE-ACETAMINOPHEN 5-325 MG PO TABS
1.0000 | ORAL_TABLET | Freq: Once | ORAL | Status: AC
  Administered 2019-10-26: 1 via ORAL

## 2019-10-26 MED ORDER — ONDANSETRON HCL 4 MG/2ML IJ SOLN: 4.0000 mg | Freq: Once | INTRAMUSCULAR | Status: DC | PRN

## 2019-10-26 MED ORDER — ROCURONIUM BROMIDE 100 MG/10ML IV SOLN
INTRAVENOUS | Status: DC | PRN
  Administered 2019-10-26: 40 mg via INTRAVENOUS

## 2019-10-26 MED ORDER — MIDAZOLAM HCL 2 MG/2ML IJ SOLN
INTRAMUSCULAR | Status: DC | PRN
  Administered 2019-10-26: 2 mg via INTRAVENOUS

## 2019-10-26 MED ORDER — FENTANYL CITRATE (PF) 100 MCG/2ML IJ SOLN
INTRAMUSCULAR | Status: AC
  Filled 2019-10-26: qty 2

## 2019-10-26 MED ORDER — SUGAMMADEX SODIUM 200 MG/2ML IV SOLN
INTRAVENOUS | Status: DC | PRN
  Administered 2019-10-26: 200 mg via INTRAVENOUS

## 2019-10-26 MED ORDER — MIDAZOLAM HCL 2 MG/2ML IJ SOLN
INTRAMUSCULAR | Status: AC
  Filled 2019-10-26: qty 2

## 2019-10-26 MED ORDER — CEFAZOLIN SODIUM-DEXTROSE 2-4 GM/100ML-% IV SOLN
INTRAVENOUS | Status: AC
  Filled 2019-10-26: qty 100

## 2019-10-26 MED ORDER — DEXAMETHASONE SODIUM PHOSPHATE 10 MG/ML IJ SOLN
INTRAMUSCULAR | Status: DC | PRN
  Administered 2019-10-26: 10 mg via INTRAVENOUS

## 2019-10-26 SURGICAL SUPPLY — 35 items
ADH SKN CLS APL DERMABOND .7 (GAUZE/BANDAGES/DRESSINGS) ×1
APL PRP STRL LF DISP 70% ISPRP (MISCELLANEOUS) ×1
BLADE SURG 15 STRL LF DISP TIS (BLADE) ×1 IMPLANT
BLADE SURG 15 STRL SS (BLADE) ×2
CANISTER SUCT 1200ML W/VALVE (MISCELLANEOUS) ×2 IMPLANT
CHLORAPREP W/TINT 26 (MISCELLANEOUS) ×2 IMPLANT
COVER WAND RF STERILE (DRAPES) ×2 IMPLANT
DERMABOND ADVANCED (GAUZE/BANDAGES/DRESSINGS) ×1
DERMABOND ADVANCED .7 DNX12 (GAUZE/BANDAGES/DRESSINGS) ×1 IMPLANT
DRAPE LAPAROTOMY 77X122 PED (DRAPES) ×2 IMPLANT
ELECT CAUTERY BLADE TIP 2.5 (TIP) ×2
ELECT REM PT RETURN 9FT ADLT (ELECTROSURGICAL) ×2
ELECTRODE CAUTERY BLDE TIP 2.5 (TIP) ×1 IMPLANT
ELECTRODE REM PT RTRN 9FT ADLT (ELECTROSURGICAL) ×1 IMPLANT
GLOVE BIO SURGEON STRL SZ 6.5 (GLOVE) ×2 IMPLANT
GLOVE INDICATOR 7.0 STRL GRN (GLOVE) ×4 IMPLANT
GOWN STRL REUS W/ TWL LRG LVL3 (GOWN DISPOSABLE) ×2 IMPLANT
GOWN STRL REUS W/TWL LRG LVL3 (GOWN DISPOSABLE) ×4
KIT TURNOVER KIT A (KITS) ×2 IMPLANT
LABEL OR SOLS (LABEL) ×2 IMPLANT
MESH VENTRALEX ST 1-7/10 CRC S (Mesh General) ×2 IMPLANT
NEEDLE HYPO 22GX1.5 SAFETY (NEEDLE) ×4 IMPLANT
NS IRRIG 500ML POUR BTL (IV SOLUTION) ×2 IMPLANT
PACK BASIN MINOR (MISCELLANEOUS) ×2 IMPLANT
STRIP CLOSURE SKIN 1/2X4 (GAUZE/BANDAGES/DRESSINGS) ×2 IMPLANT
SUT ETHIBOND NAB MO 7 #0 18IN (SUTURE) ×2 IMPLANT
SUT MNCRL 4-0 (SUTURE) ×2
SUT MNCRL 4-0 27XMFL (SUTURE) ×1
SUT VIC AB 3-0 SH 27 (SUTURE) ×2
SUT VIC AB 3-0 SH 27X BRD (SUTURE) ×1 IMPLANT
SUT VICRYL 2-0 SH 8X27 (SUTURE) ×2 IMPLANT
SUTURE MNCRL 4-0 27XMF (SUTURE) ×1 IMPLANT
SYR 10ML LL (SYRINGE) ×2 IMPLANT
SYR 20ML LL LF (SYRINGE) ×2 IMPLANT
SYR BULB IRRIG 60ML STRL (SYRINGE) ×2 IMPLANT

## 2019-10-26 NOTE — Anesthesia Procedure Notes (Signed)
Procedure Name: Intubation Date/Time: 10/26/2019 12:00 PM Performed by: Aline Brochure, CRNA Pre-anesthesia Checklist: Patient identified, Emergency Drugs available, Suction available and Patient being monitored Patient Re-evaluated:Patient Re-evaluated prior to induction Oxygen Delivery Method: Circle system utilized Preoxygenation: Pre-oxygenation with 100% oxygen Induction Type: IV induction Ventilation: Mask ventilation without difficulty Laryngoscope Size: Mac and 4 Grade View: Grade II Tube type: Oral Tube size: 7.5 mm Number of attempts: 1 Airway Equipment and Method: Stylet Placement Confirmation: ETT inserted through vocal cords under direct vision,  positive ETCO2 and breath sounds checked- equal and bilateral Secured at: 23 cm Tube secured with: Tape Dental Injury: Teeth and Oropharynx as per pre-operative assessment  Difficulty Due To: Difficult Airway- due to anterior larynx

## 2019-10-26 NOTE — Anesthesia Preprocedure Evaluation (Signed)
Anesthesia Evaluation  Patient identified by MRN, date of birth, ID band Patient awake    Reviewed: Allergy & Precautions, NPO status , Patient's Chart, lab work & pertinent test results  History of Anesthesia Complications Negative for: history of anesthetic complications  Airway Mallampati: II       Dental   Pulmonary neg sleep apnea, neg COPD, Not current smoker, former smoker,           Cardiovascular hypertension, Pt. on medications (-) Past MI and (-) CHF (-) dysrhythmias (-) Valvular Problems/Murmurs     Neuro/Psych neg Seizures    GI/Hepatic Neg liver ROS, neg GERD  ,  Endo/Other  diabetes, Type 2, Insulin Dependent, Oral Hypoglycemic Agents  Renal/GU Renal InsufficiencyRenal disease     Musculoskeletal   Abdominal   Peds  Hematology   Anesthesia Other Findings   Reproductive/Obstetrics                             Anesthesia Physical Anesthesia Plan  ASA: III  Anesthesia Plan: General   Post-op Pain Management:    Induction: Intravenous  PONV Risk Score and Plan: 2  Airway Management Planned: Oral ETT  Additional Equipment:   Intra-op Plan:   Post-operative Plan:   Informed Consent: I have reviewed the patients History and Physical, chart, labs and discussed the procedure including the risks, benefits and alternatives for the proposed anesthesia with the patient or authorized representative who has indicated his/her understanding and acceptance.       Plan Discussed with:   Anesthesia Plan Comments:         Anesthesia Quick Evaluation

## 2019-10-26 NOTE — Interval H&P Note (Signed)
History and Physical Interval Note:  10/26/2019 11:46 AM  Brandon Fry  has presented today for surgery, with the diagnosis of Umbilical hernia.  The various methods of treatment have been discussed with the patient and family. After consideration of risks, benefits and other options for treatment, the patient has consented to  Procedure(s): HERNIA REPAIR UMBILICAL ADULT, open (N/A) as a surgical intervention.  The patient's history has been reviewed, patient examined, no change in status, stable for surgery.  I have reviewed the patient's chart and labs.  Questions were answered to the patient's satisfaction.     Duanne Guess

## 2019-10-26 NOTE — Anesthesia Postprocedure Evaluation (Signed)
Anesthesia Post Note  Patient: Brandon Fry  Procedure(s) Performed: HERNIA REPAIR UMBILICAL ADULT, open (N/A Abdomen)  Patient location during evaluation: PACU Anesthesia Type: General Level of consciousness: awake and alert Pain management: pain level controlled Vital Signs Assessment: post-procedure vital signs reviewed and stable Respiratory status: spontaneous breathing and respiratory function stable Cardiovascular status: stable Anesthetic complications: no   No complications documented.   Last Vitals:  Vitals:   10/26/19 1346 10/26/19 1352  BP: 138/69 (!) 151/77  Pulse: 72 81  Resp: 14 15  Temp:    SpO2: 98% 96%    Last Pain:  Vitals:   10/26/19 1352  TempSrc:   PainSc: 0-No pain                 Judea Fennimore K

## 2019-10-26 NOTE — Discharge Instructions (Signed)
Open Hernia Repair, Adult, Care After This sheet gives you information about how to care for yourself after your procedure. Your health care provider may also give you more specific instructions. If you have problems or questions, contact your health care provider. What can I expect after the procedure? After the procedure, it is common to have:  Mild discomfort.  Slight bruising.  Minor swelling.  Pain in the abdomen. Follow these instructions at home: Incision care   Follow instructions from your health care provider about how to take care of your incision area. Make sure you: ? Wash your hands with soap and water before you change your bandage (dressing). If soap and water are not available, use hand sanitizer. ? Change your dressing as told by your health care provider. ? Leave stitches (sutures), skin glue, or adhesive strips in place. These skin closures may need to stay in place for 2 weeks or longer. If adhesive strip edges start to loosen and curl up, you may trim the loose edges. Do not remove adhesive strips completely unless your health care provider tells you to do that.  Check your incision area every day for signs of infection. Check for: ? More redness, swelling, or pain. ? More fluid or blood. ? Warmth. ? Pus or a bad smell. Activity  Do not drive or use heavy machinery while taking prescription pain medicine. Do not drive until your health care provider approves.  Until your health care provider approves: ? Do not lift anything that is heavier than 10 lb (4.5 kg). ? Do not play contact sports.  Return to your normal activities as told by your health care provider. Ask your health care provider what activities are safe. General instructions  To prevent or treat constipation while you are taking prescription pain medicine, your health care provider may recommend that you: ? Drink enough fluid to keep your urine clear or pale yellow. ? Take over-the-counter  or prescription medicines. ? Eat foods that are high in fiber, such as fresh fruits and vegetables, whole grains, and beans. ? Limit foods that are high in fat and processed sugars, such as fried and sweet foods.  Take over-the-counter and prescription medicines only as told by your health care provider.  Do not take tub baths or go swimming until your health care provider approves.  Keep all follow-up visits as told by your health care provider. This is important. Contact a health care provider if:  You develop a rash.  You have more redness, swelling, or pain around your incision.  You have more fluid or blood coming from your incision.  Your incision feels warm to the touch.  You have pus or a bad smell coming from your incision.  You have a fever or chills.  You have blood in your stool (feces).  You have not had a bowel movement in 2-3 days.  Your pain is not controlled with medicine. Get help right away if:  You have chest pain or shortness of breath.  You feel light-headed or feel faint.  You have severe pain.  You vomit and your pain is worse. This information is not intended to replace advice given to you by your health care provider. Make sure you discuss any questions you have with your health care provider. Document Revised: 12/03/2016 Document Reviewed: 06/04/2015 Elsevier Patient Education  2020 [employer] Open Hernia Repair, Adult  Open hernia repair is a surgical procedure to fix a hernia. A hernia occurs when an  internal organ or tissue pushes out through a weak spot in the abdominal wall muscles. Hernias commonly occur in the groin and around the navel. Most hernias tend to get worse over time. Often, surgery is done to prevent the hernia from becoming bigger, uncomfortable, or an emergency. Emergency surgery may be needed if abdominal contents get stuck in the opening (incarcerated hernia) or the blood supply gets cut off (strangulated hernia). In an  open repair, an incision is made in the abdomen to perform the surgery. Tell a health care provider about:  Any allergies you have.  All medicines you are taking, including vitamins, herbs, eye drops, creams, and over-the-counter medicines.  Any problems you or family members have had with anesthetic medicines.  Any blood or bone disorders you have.  Any surgeries you have had.  Any medical conditions you have, including any recent cold or flu symptoms.  Whether you are pregnant or may be pregnant. What are the risks? Generally, this is a safe procedure. However, problems may occur, including:  Long-lasting (chronic) pain.  Bleeding.  Infection.  Damage to the testicle. This can cause shrinking or swelling.  Damage to the bladder, blood vessels, intestine, or nerves near the hernia.  Trouble passing urine.  Allergic reactions to medicines.  Return of the hernia. What happens before the procedure? Staying hydrated Follow instructions from your health care provider about hydration, which may include:  Up to 2 hours before the procedure - you may continue to drink clear liquids, such as water, clear fruit juice, black coffee, and plain tea. Eating and drinking restrictions Follow instructions from your health care provider about eating and drinking, which may include:  8 hours before the procedure - stop eating heavy meals or foods such as meat, fried foods, or fatty foods.  6 hours before the procedure - stop eating light meals or foods, such as toast or cereal.  6 hours before the procedure - stop drinking milk or drinks that contain milk.  2 hours before the procedure - stop drinking clear liquids. Medicines  Ask your health care provider about: ? Changing or stopping your regular medicines. This is especially important if you are taking diabetes medicines or blood thinners. ? Taking medicines such as aspirin and ibuprofen. These medicines can thin your blood. Do  not take these medicines before your procedure if your health care provider instructs you not to.  You may be given antibiotic medicine to help prevent infection. General instructions  You may have blood tests or imaging studies.  Ask your health care provider how your surgical site will be marked or identified.  If you smoke, do not smoke for at least 2 weeks before your procedure or for as long as told by your health care provider.  Let your health care provider know if you develop a cold or any infection before your surgery.  Plan to have someone take you home from the hospital or clinic.  If you will be going home right after the procedure, plan to have someone with you for 24 hours. What happens during the procedure?  To reduce your risk of infection: ? Your health care team will wash or sanitize their hands. ? Your skin will be washed with soap. ? Hair may be removed from the surgical area.  An IV tube will be inserted into one of your veins.  You will be given one or more of the following: ? A medicine to help you relax (sedative). ? A medicine  to numb the area (local anesthetic). ? A medicine to make you fall asleep (general anesthetic).  Your surgeon will make an incision over the hernia.  The tissues of the hernia will be moved back into place.  The edges of the hernia may be stitched together.  The opening in the abdominal muscles will be closed with stitches (sutures). Or, your surgeon will place a mesh patch made of manmade (synthetic) material over the opening.  The incision will be closed.  A bandage (dressing) may be placed over the incision. The procedure may vary among health care providers and hospitals. What happens after the procedure?  Your blood pressure, heart rate, breathing rate, and blood oxygen level will be monitored until the medicines you were given have worn off.  You may be given medicine for pain.  Do not drive for 24 hours if you  received a sedative. This information is not intended to replace advice given to you by your health care provider. Make sure you discuss any questions you have with your health care provider.  AMBULATORY SURGERY  DISCHARGE INSTRUCTIONS   1) The drugs that you were given will stay in your system until tomorrow so for the next 24 hours you should not:  A) Drive an automobile B) Make any legal decisions C) Drink any alcoholic beverage   2) You may resume regular meals tomorrow.  Today it is better to start with liquids and gradually work up to solid foods.  You may eat anything you prefer, but it is better to start with liquids, then soup and crackers, and gradually work up to solid foods.   3) Please notify your doctor immediately if you have any unusual bleeding, trouble breathing, redness and pain at the surgery site, drainage, fever, or pain not relieved by medication.    4) Additional Instructions:        Please contact your physician with any problems or Same Day Surgery at [phone], Monday through Friday 6 am to 4 pm, or [place] at [facility] number at [phone]. Document Revised: 12/03/2016 Document Reviewed: 06/04/2015 Elsevier Patient Education  2020 [employer].

## 2019-10-26 NOTE — Transfer of Care (Signed)
Immediate Anesthesia Transfer of Care Note  Patient: Brandon Fry  Procedure(s) Performed: HERNIA REPAIR UMBILICAL ADULT, open (N/A Abdomen)  Patient Location: PACU  Anesthesia Type:General  Level of Consciousness: drowsy  Airway & Oxygen Therapy: Patient Spontanous Breathing and Patient connected to face mask oxygen  Post-op Assessment: Report given to RN and Post -op Vital signs reviewed and stable  Post vital signs: Reviewed and stable  Last Vitals:  Vitals Value Taken Time  BP 137/70 10/26/19 1322  Temp    Pulse 64 10/26/19 1324  Resp 12 10/26/19 1324  SpO2 99 % 10/26/19 1324  Vitals shown include unvalidated device data.  Last Pain:  Vitals:   10/26/19 1316  TempSrc:   PainSc: Asleep         Complications: No complications documented.

## 2019-10-26 NOTE — Op Note (Signed)
Operative Note  Pre-operative Diagnosis: umbilical hernia  Post-operative Diagnosis: same, incarcerated  Operation: umbilical hernia repair without mesh  Surgeon: Duanne Guess, MD  Anesthesia: GETA  Assistant:none  Findings: There was a small piece of fat incarcerated within the hernia.  The hernia itself was approximately 1.5 cm.  Estimated Blood Loss: Less than 2 cc         Drains: None         Specimens: None          Complications: None immediately apparent         Condition: stable  Procedure Details  The patient was identified in the preoperative holding area. The benefits, complications, treatment options, and expected outcomes were discussed with the patient. The risks of bleeding, infection, recurrence of symptoms, failure to resolve symptoms, bowel injury, any of which could require further surgery were reviewed with the patient. The patient agreed to accept these risks. The patient was then taken to the operating room, identified as Brandon Fry and the procedure verified.  A time out was performed and the above information confirmed.  Prior to the induction of general anesthesia, antibiotic prophylaxis was administered. VTE prophylaxis was in place. General endotracheal anesthesia was then administered and tolerated well. After induction, the abdomen was prepped with Chloraprep and draped in standard sterile fashion. The patient was positioned in the supine position.  A one-to-one mixture of 0.25% bupivacaine and 1% lidocaine with epinephrine was infiltrated into the skin and subcutaneous tissue surrounding the hernia defect.  A supraumbilical incision was created over the hernia sac and electrocautery was used to dissect through subcutaneous tissue. The hernia was dissected free from adjacent tissue and fascia. The hernia sac was entered and the sac was excised. Care was taken to avoid any injury to the bowel (none was identified).  A 4.3 cm VentralEx mesh was  introduced and attached to the fascia using interrupted 0 Ethibond sutures in standard fashion.  The fascial defect was then reapproximated with interrupted 0 Ethibond sutures.  The umbilical stalk was tacked down with 3-0 Vicryl.  Liposomal bupivacaine was injected along the fascial planes.  The cutaneous tissue was closed with 3-0 Vicryl and skin was closed with a subcuticular 4-0 Monocryl. Dermabond was applied to the skin.  Steri-Strips were applied as a dressing. Patient tolerated procedure well and there were no immediate complications identified. Needle, instrument, and sponge counts were reported to be correct by the nursing staff.  Duanne Guess, MD FACS

## 2019-10-29 ENCOUNTER — Encounter: Payer: Self-pay | Admitting: General Surgery

## 2019-11-13 ENCOUNTER — Ambulatory Visit (INDEPENDENT_AMBULATORY_CARE_PROVIDER_SITE_OTHER): Payer: No Typology Code available for payment source | Admitting: General Surgery

## 2019-11-13 ENCOUNTER — Encounter: Payer: Self-pay | Admitting: General Surgery

## 2019-11-13 ENCOUNTER — Other Ambulatory Visit: Payer: Self-pay

## 2019-11-13 VITALS — BP 133/94 | HR 69 | Temp 98.1°F | Resp 14 | Ht 70.0 in | Wt 188.0 lb

## 2019-11-13 DIAGNOSIS — K429 Umbilical hernia without obstruction or gangrene: Secondary | ICD-10-CM

## 2019-11-13 DIAGNOSIS — M19049 Primary osteoarthritis, unspecified hand: Secondary | ICD-10-CM | POA: Insufficient documentation

## 2019-11-13 NOTE — Patient Instructions (Signed)

## 2019-11-13 NOTE — Progress Notes (Signed)
Mr. Brandon Fry is here today for a postoperative visit.  He is a 64 year old man who underwent an open umbilical hernia repair with mesh on   October 26, 2019.  He states that he has done well since the operation, but noticed that there was a bulge right at the navel and wanted to have it evaluated.  He denies any fevers or chills.  No nausea or vomiting.  His pain has been well controlled.  Bowel movements are normal.  Today's Vitals   11/13/19 0956  BP: (!) 133/94  Pulse: 69  Resp: 14  Temp: 98.1 F (36.7 C)  TempSrc: Oral  SpO2: 94%  Weight: 188 lb (85.3 kg)  Height: 5\' 10"  (1.778 m)   Body mass index is 26.98 kg/m. Focused examination of the surgical site demonstrates that the transverse incision is healing nicely.  There is a small amount of crusting present, but no erythema, induration, or purulent drainage.  There is some swelling within the umbilicus itself, but I do not appreciate any fluctuance or obvious fluid collection.  Impression and plan: This is a 64 year old man who is now not quite 3 weeks postop from open umbilical hernia repair with mesh.  I do not think the swelling represents any recurrence of his hernia, but rather is likely soft tissue edema from the surgery itself.  I did tack the stalk of his umbilicus down to the underlying fascia, however the suture may have become disrupted.  I reassured him that the swelling was likely to resolve in the next few weeks to months.  He should continue to observe the 10 pound lifting restriction for a total of 6 weeks from the time of his surgery, but he may otherwise resume all of his usual activities.  I will see him on an as-needed basis.

## 2019-11-16 ENCOUNTER — Encounter: Payer: No Typology Code available for payment source | Admitting: Physician Assistant

## 2019-12-20 ENCOUNTER — Encounter: Payer: Self-pay | Admitting: General Surgery

## 2019-12-20 ENCOUNTER — Other Ambulatory Visit: Payer: Self-pay

## 2019-12-20 ENCOUNTER — Ambulatory Visit (INDEPENDENT_AMBULATORY_CARE_PROVIDER_SITE_OTHER): Payer: No Typology Code available for payment source | Admitting: General Surgery

## 2019-12-20 VITALS — BP 153/83 | HR 80 | Temp 98.6°F | Ht 70.0 in | Wt 190.0 lb

## 2019-12-20 DIAGNOSIS — T8141XA Infection following a procedure, superficial incisional surgical site, initial encounter: Secondary | ICD-10-CM

## 2019-12-20 MED ORDER — AMOXICILLIN-POT CLAVULANATE 875-125 MG PO TABS: 1.0000 | ORAL_TABLET | Freq: Two times a day (BID) | ORAL | 0 refills | Status: AC

## 2019-12-20 NOTE — Progress Notes (Signed)
Brandon Fry is here today for a postoperative visit.  He is a 64 year old man who underwent an open umbilical hernia repair on October 26, 2019.  I saw him for his scheduled postop visit on November 13, 2019.  He had some swelling at the umbilicus but this was felt to be secondary to soft tissue edema.  He reports that that actually resolved but this past Monday, he experienced worsening pain around his umbilicus while he was out of town for business.  He says that the pain and redness has spread, therefore he contacted our office for a visit.  He reports having low-grade fevers with some chills at home.  He is having normal bowel movements..  Today's Vitals   12/20/19 1426  BP: (!) 153/83  Pulse: 80  Temp: 98.6 F (37 C)  TempSrc: Oral  SpO2: 99%  Weight: 190 lb (86.2 kg)  Height: 5\' 10"  (1.778 m)   Body mass index is 27.26 kg/m. Physical Exam Abdominal:     Tenderness: There is abdominal tenderness in the periumbilical area.       Comments: There is erythema and induration surrounding the umbilicus.  The surgical incision is healing well with just a small amount of scab present there is no fluctuance or fluid collection appreciated on exam.    Impression and plan: This is a 64 year old man who was nearly 8 weeks out from an open umbilical hernia repair.  Just a few days ago, however, he developed erythema and pain.  He appears to have cellulitis, but I do not appreciate an abscess at this time.  I have prescribed a 14-day course of Augmentin.  He may take over-the-counter analgesics as needed.  If he is not starting to improve within about 5 days, he should contact our office for further evaluation.  Otherwise, I will see him as needed.

## 2019-12-20 NOTE — Patient Instructions (Addendum)
Please pick up your Antibiotic today and begin taking today. Also you can try Tylenol and Ibuprofen for the discomfort. Please call our office if no improvement after 5 days.

## 2019-12-25 ENCOUNTER — Telehealth: Payer: Self-pay | Admitting: General Surgery

## 2019-12-25 ENCOUNTER — Encounter: Payer: Self-pay | Admitting: Physician Assistant

## 2019-12-25 NOTE — Telephone Encounter (Signed)
Patient's wife is calling and is asking for one of the nurses to give her a call back, she has some questions. Please call patient and advise.

## 2019-12-25 NOTE — Telephone Encounter (Signed)
The patient's wife called today and states that the area at his umbilical incision opened up and drained a large amount of pus. The drainage is yellow-brown. She states the area keeps sealing up and then draining. He reports some low grade fever(99-100) and fatigue. He was seen last week and is currently on Augmentin for this. He will come in to see Lynden Oxford PA-C tomorrow as the patient is currently out of town.

## 2019-12-26 ENCOUNTER — Encounter: Payer: Self-pay | Admitting: Physician Assistant

## 2019-12-26 ENCOUNTER — Other Ambulatory Visit: Payer: Self-pay

## 2019-12-26 ENCOUNTER — Ambulatory Visit (INDEPENDENT_AMBULATORY_CARE_PROVIDER_SITE_OTHER): Payer: No Typology Code available for payment source | Admitting: Physician Assistant

## 2019-12-26 VITALS — BP 146/96 | HR 82 | Temp 98.5°F | Ht 70.0 in | Wt 190.2 lb

## 2019-12-26 DIAGNOSIS — Z09 Encounter for follow-up examination after completed treatment for conditions other than malignant neoplasm: Secondary | ICD-10-CM

## 2019-12-26 DIAGNOSIS — T8141XA Infection following a procedure, superficial incisional surgical site, initial encounter: Secondary | ICD-10-CM

## 2019-12-26 DIAGNOSIS — K429 Umbilical hernia without obstruction or gangrene: Secondary | ICD-10-CM

## 2019-12-26 NOTE — Patient Instructions (Signed)
Please continue taking the Antibiotic until gone.

## 2019-12-27 ENCOUNTER — Encounter: Payer: Self-pay | Admitting: Physician Assistant

## 2019-12-27 NOTE — Progress Notes (Signed)
[facility] POST-OP OFFICE VISIT  12/27/2019  HPI: Brandon Fry is a 64 y.o. male 2 months s/p umbilical hernia repair with Dr Brandon Fry  He had initially done well, however, around 1 week ago, he began to notice erythema around his umbilicus. He was seen by Dr Brandon Fry on 12/16 and thought to have cellulitis. He was given a 14 day course of Augmentin. He presents again today because the wound continued to become more swollen and started to spontaneously drain purulence. He reports being able to express more from the wound this morning in the shower. Since it began draining, he has noticed the erythema, swelling, and pain have significantly improved and the drainage has stopped. He denied any fever or chills. He has tolerated the Augmentin without issues.    Vital signs: BP (!) 146/96   Pulse 82   Temp 98.5 F (36.9 C) (Oral)   Ht 5\' 10"  (1.778 m)   Wt 190 lb 3.2 oz (86.3 kg)   SpO2 96%   BMI 27.29 kg/m    Physical Exam: Constitutional: Well appearing male, NAD Abdomen: Soft, non-tender, non-distended, no rebound/guarding Skin: His umbilical incision has a 1 cm area on the left lateral aspect which appears to be site of drainage, there is very mild erythema present now, there is no appreciable fluctuance and I was unable to express any drainage with multiple attempts and probing. He did show me pictures on his phone, and this area appears remarkably improved.  Assessment/Plan: This is a 65 y.o. male  2 months s/p umbilical hernia repair complicated by delayed wound infection   - At this point, I believe he has spontaneously drained and there is no further fluctuance or purulence to pursue additional I&D. He will continue his ABx as prescribed. He will follow up next week to ensure improvement/resolution. He understands that should this recur, or continue to intermittently drain, we will need to formal open this wound and allow it to heal via secondary intention.    - he  will rtc next week  -- 70, PA-C [facility] 12/27/2019, 7:59 AM [phone] M-F: 7am - 4pm

## 2019-12-31 ENCOUNTER — Encounter: Payer: Self-pay | Admitting: Physician Assistant

## 2019-12-31 ENCOUNTER — Other Ambulatory Visit: Payer: Self-pay

## 2019-12-31 ENCOUNTER — Ambulatory Visit (INDEPENDENT_AMBULATORY_CARE_PROVIDER_SITE_OTHER): Payer: No Typology Code available for payment source | Admitting: Physician Assistant

## 2019-12-31 VITALS — BP 156/95 | HR 66 | Temp 97.8°F | Ht 70.0 in | Wt 189.6 lb

## 2019-12-31 DIAGNOSIS — K429 Umbilical hernia without obstruction or gangrene: Secondary | ICD-10-CM

## 2019-12-31 DIAGNOSIS — T8141XA Infection following a procedure, superficial incisional surgical site, initial encounter: Secondary | ICD-10-CM

## 2019-12-31 DIAGNOSIS — Z09 Encounter for follow-up examination after completed treatment for conditions other than malignant neoplasm: Secondary | ICD-10-CM

## 2019-12-31 NOTE — Patient Instructions (Addendum)
If you have any concerns or questions, please feel free to call our office.    

## 2019-12-31 NOTE — Progress Notes (Signed)
[facility] POST-OP OFFICE VISIT  12/31/2019  HPI: Brandon Fry is a 64 y.o. male 2 months s/p umbilical hernia repair with Dr Lady Gary  He had initially done well, however, around 1 week ago, he began to notice erythema around his umbilicus. He was seen by Dr Lady Gary on 12/16 and thought to have cellulitis. He was given a 14 day course of Augmentin. I saw him on 12/22, and his incision had started spontaneously draining. At that time, he has made significant improvements, so intervention was deferred. He presents today for reassessment. No fever, chills. Drainage became serous and subsided. Erythema resolved. No other issues.   Vital signs: BP (!) 156/95   Pulse 66   Temp 97.8 F (36.6 C) (Oral)   Ht 5\' 10"  (1.778 m)   Wt 189 lb 9.6 oz (86 kg)   SpO2 97%   BMI 27.20 kg/m    Physical Exam: Constitutional: Well appearing male, NAD Abdomen: Soft, non-tender, non-distended, no rebound/guarding Skin: Previous erythema seen around umbilical incision has resolved, no drainage, non-tender, no fluctuance  Assessment/Plan: This is a 64 y.o. male 2 months s/p umbilical hernia repair with Dr 68   - Wound infection resolved  - Reviewed signs/symptoms to prompt return  - Otherwise will rtc prn  -- Lady Gary, PA-C [facility] 12/31/2019, 9:58 AM [phone] M-F: 7am - 4pm

## 2020-02-11 ENCOUNTER — Ambulatory Visit: Payer: PRIVATE HEALTH INSURANCE | Admitting: Dermatology

## 2020-02-11 ENCOUNTER — Other Ambulatory Visit: Payer: Self-pay

## 2020-02-11 DIAGNOSIS — L711 Rhinophyma: Secondary | ICD-10-CM

## 2020-02-11 DIAGNOSIS — L719 Rosacea, unspecified: Secondary | ICD-10-CM | POA: Diagnosis not present

## 2020-02-11 NOTE — Patient Instructions (Addendum)
Instructions for [facility] Medications  One or more of your medications was sent to the [facility] mail order compounding pharmacy. You will receive an email from them and can purchase the medicine through that link. It will then be mailed to your home at the address you confirmed. If for any reason you do not receive an email from them, please check your spam folder. If you still do not find the email, please let us know. [facility] phone number is [phone].  Rosacea  What is rosacea? Rosacea (say: ro-zay-sha) is a common skin disease that usually begins as a trend of flushing or blushing easily.  As rosacea progresses, a persistent redness in the center of the face will develop and may gradually spread beyond the nose and cheeks to the forehead and chin.  In some cases, the ears, chest, and back could be affected.  Rosacea may appear as tiny blood vessels or small red bumps that occur in crops.  Frequently they can contain pus, and are called "pustules".  If the bumps do not contain pus, they are referred to as "papules".  Rarely, in prolonged, untreated cases of rosacea, the oil glands of the nose and cheeks may become permanently enlarged.  This is called rhinophyma, and is seen more frequently in men.  Signs and Risks In its beginning stages, rosacea tends to come and go, which makes it difficult to recognize.  It can start as intermittent flushing of the face.  Eventually, blood vessels may become permanently visible.  Pustules and papules can appear, but can be mistaken for adult acne.  People of all races, ages, genders and ethnic groups are at risk of developing rosacea.  However, it is more common in women (especially around menopause) and adults with fair skin between the ages of 23 and 32.  Treatment Dermatologists typically recommend a combination of treatments to effectively manage rosacea.  Treatment can improve symptoms and may stop the progression of the rosacea.   Treatment may involve both topical and oral medications.  The tetracycline antibiotics are often used for their anti-inflammatory effect; however, because of the possibility of developing antibiotic resistance, they should not be used long term at full dose.  For dilated blood vessels the options include electrodessication (uses electric current through a small needle), laser treatment, and cosmetics to hide the redness.   With all forms of treatment, improvement is a slow process, and patients may not see any results for the first 3-4 weeks.  It is very important to avoid the sun and other triggers.  Patients must wear sunscreen daily.  Skin Care Instructions: 1. Cleanse the skin with a mild soap such as CeraVe cleanser, Cetaphil cleanser, or Dove soap once or twice daily as needed. 2. Moisturize with Eucerin Redness Relief Daily Perfecting Lotion (has a subtle green tint), CeraVe Moisturizing Cream, or Oil of Olay Daily Moisturizer with sunscreen every morning and/or night as recommended. 3. Makeup should be "non-comedogenic" (won't clog pores) and be labeled "for sensitive skin". Good choices for cosmetics are: Neutrogena, Almay, and Physician's Formula.  Any product with a green tint tends to offset a red complexion. 4. If your eyes are dry and irritated, use artificial tears 2-3 times per day and cleanse the eyelids daily with baby shampoo.  Have your eyes examined at least every 2 years.  Be sure to tell your eye doctor that you have rosacea. 5. Alcoholic beverages tend to cause flushing of the skin, and may make rosacea worse.  6. Always wear sunscreen, protect your skin from extreme hot and cold temperatures, and avoid spicy foods, hot drinks, and mechanical irritation such as rubbing, scrubbing, or massaging the face.  Avoid harsh skin cleansers, cleansing masks, astringents, and exfoliation. If a particular product burns or makes your face feel tight, then it is likely to flare your rosacea. 7. If  you are having difficulty finding a sunscreen that you can tolerate, you may try switching to a chemical-free sunscreen.  These are ones whose active ingredient is zinc oxide or titanium dioxide only.  They should also be fragrance free, non-comedogenic, and labeled for sensitive skin. 8. Rosacea triggers may vary from person to person.  There are a variety of foods that have been reported to trigger rosacea.  Some patients find that keeping a diary of what they were doing when they flared helps them avoid triggers.

## 2020-02-11 NOTE — Progress Notes (Signed)
   New Patient Visit  Subjective  Brandon Fry is a 65 y.o. male who presents for the following: rosacea (Of the nose and cheeks - patient currently using Metronidazole 0.75% gel about 3 days per week and has notice some improvement since starting medication).  The following portions of the chart were reviewed this encounter and updated as appropriate:   Tobacco  Allergies  Meds  Problems  Med Hx  Surg Hx  Fam Hx     Review of Systems:  No other skin or systemic complaints except as noted in HPI or Assessment and Plan.  Objective  Well appearing patient in no apparent distress; mood and affect are within normal limits.  A focused examination was performed including the face. Relevant physical exam findings are noted in the Assessment and Plan.  Objective  Face: Thickening of the skin on the nose. Three active small papules.   Images        Assessment & Plan  Rosacea Face With rhinophyma -  Discussed BBL and/or Halo laser to treat the rhinophyma aspect.  Start [facility] Triple cream QHS.   D/C Metronidazole.   Consider Doxycycline in the future.  Rosacea is a chronic progressive skin condition usually affecting the face of adults, causing redness and/or acne bumps. It is treatable but not curable. It sometimes affects the eyes (ocular rosacea) as well. It may respond to topical and/or systemic medication and can flare with stress, sun exposure, alcohol, exercise and some foods.  Daily application of broad spectrum spf 30+ sunscreen to face is recommended to reduce flares.  Return in about 3 months (around 05/10/2020).  Maylene Roes, CMA, am acting as scribe for Armida Sans, MD .  Documentation: I have reviewed the above documentation for accuracy and completeness, and I agree with the above.  Armida Sans, MD

## 2020-02-14 ENCOUNTER — Encounter: Payer: Self-pay | Admitting: Dermatology

## 2020-04-02 ENCOUNTER — Ambulatory Visit: Payer: Self-pay | Admitting: Urology

## 2020-04-07 ENCOUNTER — Telehealth: Payer: Self-pay | Admitting: Urology

## 2020-04-07 ENCOUNTER — Encounter: Payer: Self-pay | Admitting: Urology

## 2020-04-07 ENCOUNTER — Ambulatory Visit: Payer: No Typology Code available for payment source | Admitting: Urology

## 2020-04-07 ENCOUNTER — Other Ambulatory Visit: Payer: Self-pay

## 2020-04-07 VITALS — BP 157/88 | HR 69 | Ht 70.0 in | Wt 190.0 lb

## 2020-04-07 DIAGNOSIS — N5082 Scrotal pain: Secondary | ICD-10-CM | POA: Diagnosis not present

## 2020-04-07 DIAGNOSIS — N50819 Testicular pain, unspecified: Secondary | ICD-10-CM

## 2020-04-07 DIAGNOSIS — N500 Atrophy of testis: Secondary | ICD-10-CM | POA: Diagnosis not present

## 2020-04-07 LAB — URINALYSIS, COMPLETE
Bilirubin, UA: NEGATIVE
Leukocytes,UA: NEGATIVE
Nitrite, UA: NEGATIVE
RBC, UA: NEGATIVE
Specific Gravity, UA: >1.03 — ABNORMAL HIGH (ref 1.005–1.030)
Urobilinogen, Ur: 0.2 mg/dL (ref 0.2–1.0)
pH, UA: 5 (ref 5.0–7.5)

## 2020-04-07 LAB — MICROSCOPIC EXAMINATION
Bacteria, UA: NONE SEEN
Epithelial Cells (non renal): NONE SEEN /hpf (ref 0–10)
RBC, Urine: NONE SEEN /hpf (ref 0–2)

## 2020-04-07 NOTE — Telephone Encounter (Signed)
Noted  

## 2020-04-07 NOTE — Telephone Encounter (Signed)
Pt has decided not to have ultrasound that was discussed at appointment.  Please do not send referral.

## 2020-04-07 NOTE — Progress Notes (Signed)
04/07/2020 10:37 AM   Brandon Fry 09/10/1955 [id number]  Referring provider: Dorothey Baseman, MD [phone] [address] [place],  [place] [address]  Chief Complaint  Patient presents with  . Testicle Pain    HPI: 65 y.o. male referred for evaluation of scrotal pain.   ~ 6 month history of right hemiscrotal pain  Pain described as mild and typically only present when pressure is applied to the area  He is more concerned about the possibility of cancer than the discomfort which he states is mild  Prior history of a vasectomy 6 years ago  No bothersome LUTS  Denies gross hematuria   PMH: Past Medical History:  Diagnosis Date  . Arthritis    both feet and hands  . Chronic kidney disease    Stage 1  . Diabetes mellitus without complication (HCC)   . Hyperlipidemia   . Hypertension   . Rosacea 07/05/2019    Surgical History: Past Surgical History:  Procedure Laterality Date  . APPENDECTOMY    . COLONOSCOPY    . TRIGGER FINGER RELEASE    . UMBILICAL HERNIA REPAIR N/A 10/26/2019   Procedure: HERNIA REPAIR UMBILICAL ADULT, open;  Surgeon: Duanne Guess, MD;  Location: [facility] ORS;  Service: General;  Laterality: N/A;    Home Medications:  Allergies as of 04/07/2020      Reactions   Sulfa Antibiotics Rash      Medication List       Accurate as of April 07, 2020 10:37 AM. If you have any questions, ask your nurse or doctor.        ezetimibe 10 MG tablet Commonly known as: ZETIA Take 10 mg by mouth daily.   glipizide-metformin 2.5-250 MG tablet Commonly known as: METAGLIP Take 2 tablets by mouth 2 (two) times daily before a meal.   insulin NPH Human 100 UNIT/ML injection Commonly known as: NOVOLIN N Inject 35 Units into the skin every evening.   lisinopril 20 MG tablet Commonly known as: ZESTRIL Take 20 mg by mouth 2 (two) times daily.   metoprolol succinate 50 MG 24 hr tablet Commonly known as: TOPROL-XL TAKE 1/2 TABLET A DAY INITIALLY, CAN INCREASE  TO 1 TABLET A DAY AFTER 5 TO 7 DAYS IF BP REMAINS HIGH   metroNIDAZOLE 0.75 % gel Commonly known as: METROGEL Apply 1 application topically 2 (two) times daily as needed (rosacea).   simvastatin 20 MG tablet Commonly known as: ZOCOR Take 20 mg by mouth daily.   sodium chloride 0.65 % Soln nasal spray Commonly known as: OCEAN Place 1 spray into both nostrils as needed (dryness/congestion).   Trulicity 1.5 MG/0.5ML Sopn Generic drug: Dulaglutide Inject 1.5 mg into the skin every Monday.       Allergies:  Allergies  Allergen Reactions  . Sulfa Antibiotics Rash    Family History: Family History  Problem Relation Age of Onset  . Pulmonary embolism Father     Social History:  reports that he quit smoking about 32 years ago. He has never used smokeless tobacco. He reports that he does not use drugs. No history on file for alcohol use.   Physical Exam: BP (!) 157/88   Pulse 69   Ht 5\' 10"  (1.778 m)   Wt 190 lb (86.2 kg)   BMI 27.26 kg/m   Constitutional:  Alert and oriented, No acute distress. HEENT: [place] AT, moist mucus membranes.  Trachea midline, no masses. Cardiovascular: No clubbing, cyanosis, or edema. Respiratory: Normal respiratory effort, no increased  work of breathing. GI: Abdomen is soft, nontender, nondistended, no abdominal masses GU: Phallus without lesions, testes descended bilaterally and are atrophic L >R.  No testicular tenderness.  There is prominence of the right epididymis without mass; mild tenderness.  Nontender right sperm granuloma Skin: No rashes, bruises or suspicious lesions. Neurologic: Grossly intact, no focal deficits, moving all 4 extremities. Psychiatric: Normal mood and affect.   Assessment & Plan:    1.  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  Prior history of  vasectomy  He was informed based on his exam I think there is a greater than 95% chance his pain is from a benign etiology and a scrotal ultrasound would improve chances to 100%.  He desired to schedule and will call with results   Riki Altes, MD  [facility] [address] [place], [place] [address] [phone]

## 2020-05-12 ENCOUNTER — Ambulatory Visit: Payer: PRIVATE HEALTH INSURANCE | Admitting: Dermatology

## 2020-10-20 ENCOUNTER — Encounter: Payer: Self-pay | Admitting: General Surgery

## 2022-04-16 DIAGNOSIS — I10 Essential (primary) hypertension: Secondary | ICD-10-CM | POA: Diagnosis not present

## 2022-04-16 DIAGNOSIS — E785 Hyperlipidemia, unspecified: Secondary | ICD-10-CM | POA: Diagnosis not present

## 2022-04-16 DIAGNOSIS — E1122 Type 2 diabetes mellitus with diabetic chronic kidney disease: Secondary | ICD-10-CM | POA: Diagnosis not present

## 2022-04-16 DIAGNOSIS — Z125 Encounter for screening for malignant neoplasm of prostate: Secondary | ICD-10-CM | POA: Diagnosis not present

## 2022-04-16 DIAGNOSIS — N181 Chronic kidney disease, stage 1: Secondary | ICD-10-CM | POA: Diagnosis not present

## 2022-04-19 DIAGNOSIS — I129 Hypertensive chronic kidney disease with stage 1 through stage 4 chronic kidney disease, or unspecified chronic kidney disease: Secondary | ICD-10-CM | POA: Diagnosis not present

## 2022-04-19 DIAGNOSIS — E1122 Type 2 diabetes mellitus with diabetic chronic kidney disease: Secondary | ICD-10-CM | POA: Diagnosis not present

## 2022-04-19 DIAGNOSIS — K760 Fatty (change of) liver, not elsewhere classified: Secondary | ICD-10-CM | POA: Diagnosis not present

## 2022-04-19 DIAGNOSIS — E785 Hyperlipidemia, unspecified: Secondary | ICD-10-CM | POA: Diagnosis not present

## 2022-04-19 DIAGNOSIS — N181 Chronic kidney disease, stage 1: Secondary | ICD-10-CM | POA: Diagnosis not present

## 2022-04-19 DIAGNOSIS — Z87891 Personal history of nicotine dependence: Secondary | ICD-10-CM | POA: Diagnosis not present

## 2022-05-12 DIAGNOSIS — E119 Type 2 diabetes mellitus without complications: Secondary | ICD-10-CM | POA: Diagnosis not present

## 2022-05-12 DIAGNOSIS — Q828 Other specified congenital malformations of skin: Secondary | ICD-10-CM | POA: Diagnosis not present

## 2022-08-05 DIAGNOSIS — L718 Other rosacea: Secondary | ICD-10-CM | POA: Diagnosis not present

## 2022-08-05 DIAGNOSIS — D2261 Melanocytic nevi of right upper limb, including shoulder: Secondary | ICD-10-CM | POA: Diagnosis not present

## 2022-08-05 DIAGNOSIS — D2262 Melanocytic nevi of left upper limb, including shoulder: Secondary | ICD-10-CM | POA: Diagnosis not present

## 2022-08-05 DIAGNOSIS — R202 Paresthesia of skin: Secondary | ICD-10-CM | POA: Diagnosis not present

## 2022-08-05 DIAGNOSIS — D225 Melanocytic nevi of trunk: Secondary | ICD-10-CM | POA: Diagnosis not present

## 2022-08-05 DIAGNOSIS — D2271 Melanocytic nevi of right lower limb, including hip: Secondary | ICD-10-CM | POA: Diagnosis not present

## 2022-08-05 DIAGNOSIS — L578 Other skin changes due to chronic exposure to nonionizing radiation: Secondary | ICD-10-CM | POA: Diagnosis not present

## 2022-08-05 DIAGNOSIS — D2272 Melanocytic nevi of left lower limb, including hip: Secondary | ICD-10-CM | POA: Diagnosis not present

## 2022-08-05 DIAGNOSIS — L218 Other seborrheic dermatitis: Secondary | ICD-10-CM | POA: Diagnosis not present

## 2022-09-21 DIAGNOSIS — M25511 Pain in right shoulder: Secondary | ICD-10-CM | POA: Diagnosis not present

## 2022-09-25 DIAGNOSIS — M25511 Pain in right shoulder: Secondary | ICD-10-CM | POA: Diagnosis not present

## 2022-09-28 DIAGNOSIS — M75101 Unspecified rotator cuff tear or rupture of right shoulder, not specified as traumatic: Secondary | ICD-10-CM | POA: Diagnosis not present

## 2022-10-05 DIAGNOSIS — M25611 Stiffness of right shoulder, not elsewhere classified: Secondary | ICD-10-CM | POA: Diagnosis not present

## 2022-10-05 DIAGNOSIS — M25511 Pain in right shoulder: Secondary | ICD-10-CM | POA: Diagnosis not present

## 2022-10-06 DIAGNOSIS — E1122 Type 2 diabetes mellitus with diabetic chronic kidney disease: Secondary | ICD-10-CM | POA: Diagnosis not present

## 2022-10-06 DIAGNOSIS — N181 Chronic kidney disease, stage 1: Secondary | ICD-10-CM | POA: Diagnosis not present

## 2022-10-18 DIAGNOSIS — Z794 Long term (current) use of insulin: Secondary | ICD-10-CM | POA: Diagnosis not present

## 2022-10-18 DIAGNOSIS — M25511 Pain in right shoulder: Secondary | ICD-10-CM | POA: Diagnosis not present

## 2022-10-18 DIAGNOSIS — M25611 Stiffness of right shoulder, not elsewhere classified: Secondary | ICD-10-CM | POA: Diagnosis not present

## 2022-10-18 DIAGNOSIS — Z23 Encounter for immunization: Secondary | ICD-10-CM | POA: Diagnosis not present

## 2022-10-18 DIAGNOSIS — E119 Type 2 diabetes mellitus without complications: Secondary | ICD-10-CM | POA: Diagnosis not present

## 2022-10-18 DIAGNOSIS — Z0000 Encounter for general adult medical examination without abnormal findings: Secondary | ICD-10-CM | POA: Diagnosis not present

## 2022-10-22 DIAGNOSIS — M25611 Stiffness of right shoulder, not elsewhere classified: Secondary | ICD-10-CM | POA: Diagnosis not present

## 2022-10-22 DIAGNOSIS — M25511 Pain in right shoulder: Secondary | ICD-10-CM | POA: Diagnosis not present

## 2022-11-09 DIAGNOSIS — M25511 Pain in right shoulder: Secondary | ICD-10-CM | POA: Diagnosis not present

## 2022-11-09 DIAGNOSIS — M25611 Stiffness of right shoulder, not elsewhere classified: Secondary | ICD-10-CM | POA: Diagnosis not present

## 2022-11-12 DIAGNOSIS — M25511 Pain in right shoulder: Secondary | ICD-10-CM | POA: Diagnosis not present

## 2022-11-12 DIAGNOSIS — M25611 Stiffness of right shoulder, not elsewhere classified: Secondary | ICD-10-CM | POA: Diagnosis not present

## 2022-11-23 DIAGNOSIS — M25611 Stiffness of right shoulder, not elsewhere classified: Secondary | ICD-10-CM | POA: Diagnosis not present

## 2022-11-23 DIAGNOSIS — M25511 Pain in right shoulder: Secondary | ICD-10-CM | POA: Diagnosis not present

## 2022-11-25 DIAGNOSIS — M25611 Stiffness of right shoulder, not elsewhere classified: Secondary | ICD-10-CM | POA: Diagnosis not present

## 2022-11-25 DIAGNOSIS — M67811 Other specified disorders of synovium, right shoulder: Secondary | ICD-10-CM | POA: Diagnosis not present

## 2022-11-25 DIAGNOSIS — M25511 Pain in right shoulder: Secondary | ICD-10-CM | POA: Diagnosis not present

## 2022-12-22 DIAGNOSIS — K429 Umbilical hernia without obstruction or gangrene: Secondary | ICD-10-CM | POA: Diagnosis not present

## 2022-12-22 DIAGNOSIS — R1033 Periumbilical pain: Secondary | ICD-10-CM | POA: Diagnosis not present

## 2022-12-22 DIAGNOSIS — Z8719 Personal history of other diseases of the digestive system: Secondary | ICD-10-CM | POA: Diagnosis not present

## 2022-12-22 DIAGNOSIS — I878 Other specified disorders of veins: Secondary | ICD-10-CM | POA: Diagnosis not present

## 2022-12-22 DIAGNOSIS — R1084 Generalized abdominal pain: Secondary | ICD-10-CM | POA: Diagnosis not present

## 2022-12-23 ENCOUNTER — Ambulatory Visit
Admission: RE | Admit: 2022-12-23 | Discharge: 2022-12-23 | Disposition: A | Discharge status: Home / Self Care | Payer: Medicare HMO | Source: Ambulatory Visit | Attending: Family Medicine | Admitting: Family Medicine

## 2022-12-23 ENCOUNTER — Other Ambulatory Visit: Payer: Self-pay | Admitting: Family Medicine

## 2022-12-23 DIAGNOSIS — R1084 Generalized abdominal pain: Secondary | ICD-10-CM

## 2022-12-23 DIAGNOSIS — Z9889 Other specified postprocedural states: Secondary | ICD-10-CM | POA: Diagnosis not present

## 2022-12-23 DIAGNOSIS — R161 Splenomegaly, not elsewhere classified: Secondary | ICD-10-CM | POA: Diagnosis not present

## 2022-12-23 DIAGNOSIS — R1033 Periumbilical pain: Secondary | ICD-10-CM | POA: Diagnosis not present

## 2022-12-23 DIAGNOSIS — Z8719 Personal history of other diseases of the digestive system: Secondary | ICD-10-CM | POA: Insufficient documentation

## 2022-12-23 DIAGNOSIS — R109 Unspecified abdominal pain: Secondary | ICD-10-CM | POA: Diagnosis not present

## 2022-12-23 DIAGNOSIS — K828 Other specified diseases of gallbladder: Secondary | ICD-10-CM | POA: Diagnosis not present

## 2022-12-23 DIAGNOSIS — K8020 Calculus of gallbladder without cholecystitis without obstruction: Secondary | ICD-10-CM | POA: Diagnosis not present

## 2023-03-16 DIAGNOSIS — H524 Presbyopia: Secondary | ICD-10-CM | POA: Diagnosis not present

## 2023-03-16 DIAGNOSIS — E119 Type 2 diabetes mellitus without complications: Secondary | ICD-10-CM | POA: Diagnosis not present

## 2023-03-16 DIAGNOSIS — H5203 Hypermetropia, bilateral: Secondary | ICD-10-CM | POA: Diagnosis not present

## 2023-03-16 DIAGNOSIS — Z0100 Encounter for examination of eyes and vision without abnormal findings: Secondary | ICD-10-CM | POA: Diagnosis not present

## 2023-03-16 DIAGNOSIS — H33301 Unspecified retinal break, right eye: Secondary | ICD-10-CM | POA: Diagnosis not present

## 2023-05-03 DIAGNOSIS — E1122 Type 2 diabetes mellitus with diabetic chronic kidney disease: Secondary | ICD-10-CM | POA: Diagnosis not present

## 2023-05-03 DIAGNOSIS — N181 Chronic kidney disease, stage 1: Secondary | ICD-10-CM | POA: Diagnosis not present

## 2023-07-04 DIAGNOSIS — D692 Other nonthrombocytopenic purpura: Secondary | ICD-10-CM | POA: Diagnosis not present

## 2023-07-04 DIAGNOSIS — D2271 Melanocytic nevi of right lower limb, including hip: Secondary | ICD-10-CM | POA: Diagnosis not present

## 2023-07-04 DIAGNOSIS — D2272 Melanocytic nevi of left lower limb, including hip: Secondary | ICD-10-CM | POA: Diagnosis not present

## 2023-07-04 DIAGNOSIS — D2262 Melanocytic nevi of left upper limb, including shoulder: Secondary | ICD-10-CM | POA: Diagnosis not present

## 2023-07-04 DIAGNOSIS — L718 Other rosacea: Secondary | ICD-10-CM | POA: Diagnosis not present

## 2023-07-04 DIAGNOSIS — L821 Other seborrheic keratosis: Secondary | ICD-10-CM | POA: Diagnosis not present

## 2023-07-04 DIAGNOSIS — D225 Melanocytic nevi of trunk: Secondary | ICD-10-CM | POA: Diagnosis not present

## 2023-07-04 DIAGNOSIS — D2261 Melanocytic nevi of right upper limb, including shoulder: Secondary | ICD-10-CM | POA: Diagnosis not present

## 2023-07-28 DIAGNOSIS — M65332 Trigger finger, left middle finger: Secondary | ICD-10-CM | POA: Diagnosis not present

## 2023-07-28 DIAGNOSIS — M67811 Other specified disorders of synovium, right shoulder: Secondary | ICD-10-CM | POA: Diagnosis not present

## 2023-07-28 DIAGNOSIS — M25511 Pain in right shoulder: Secondary | ICD-10-CM | POA: Diagnosis not present

## 2023-08-10 DIAGNOSIS — B9689 Other specified bacterial agents as the cause of diseases classified elsewhere: Secondary | ICD-10-CM | POA: Diagnosis not present

## 2023-08-10 DIAGNOSIS — J209 Acute bronchitis, unspecified: Secondary | ICD-10-CM | POA: Diagnosis not present

## 2023-08-10 DIAGNOSIS — J019 Acute sinusitis, unspecified: Secondary | ICD-10-CM | POA: Diagnosis not present

## 2023-08-10 DIAGNOSIS — R509 Fever, unspecified: Secondary | ICD-10-CM | POA: Diagnosis not present

## 2023-08-10 DIAGNOSIS — J029 Acute pharyngitis, unspecified: Secondary | ICD-10-CM | POA: Diagnosis not present

## 2023-08-10 DIAGNOSIS — Z03818 Encounter for observation for suspected exposure to other biological agents ruled out: Secondary | ICD-10-CM | POA: Diagnosis not present

## 2023-08-10 DIAGNOSIS — R051 Acute cough: Secondary | ICD-10-CM | POA: Diagnosis not present

## 2023-09-20 DIAGNOSIS — N181 Chronic kidney disease, stage 1: Secondary | ICD-10-CM | POA: Diagnosis not present

## 2023-09-20 DIAGNOSIS — E1122 Type 2 diabetes mellitus with diabetic chronic kidney disease: Secondary | ICD-10-CM | POA: Diagnosis not present

## 2023-09-20 DIAGNOSIS — E785 Hyperlipidemia, unspecified: Secondary | ICD-10-CM | POA: Diagnosis not present

## 2023-09-20 DIAGNOSIS — Z125 Encounter for screening for malignant neoplasm of prostate: Secondary | ICD-10-CM | POA: Diagnosis not present

## 2023-09-21 DIAGNOSIS — Z794 Long term (current) use of insulin: Secondary | ICD-10-CM | POA: Diagnosis not present

## 2023-09-21 DIAGNOSIS — Z23 Encounter for immunization: Secondary | ICD-10-CM | POA: Diagnosis not present

## 2023-09-21 DIAGNOSIS — N181 Chronic kidney disease, stage 1: Secondary | ICD-10-CM | POA: Diagnosis not present

## 2023-09-21 DIAGNOSIS — Z0000 Encounter for general adult medical examination without abnormal findings: Secondary | ICD-10-CM | POA: Diagnosis not present

## 2023-09-21 DIAGNOSIS — E1122 Type 2 diabetes mellitus with diabetic chronic kidney disease: Secondary | ICD-10-CM | POA: Diagnosis not present

## 2023-09-21 DIAGNOSIS — Z87891 Personal history of nicotine dependence: Secondary | ICD-10-CM | POA: Diagnosis not present

## 2023-09-21 DIAGNOSIS — I129 Hypertensive chronic kidney disease with stage 1 through stage 4 chronic kidney disease, or unspecified chronic kidney disease: Secondary | ICD-10-CM | POA: Diagnosis not present

## 2023-09-21 DIAGNOSIS — E785 Hyperlipidemia, unspecified: Secondary | ICD-10-CM | POA: Diagnosis not present

## 2023-09-21 DIAGNOSIS — K760 Fatty (change of) liver, not elsewhere classified: Secondary | ICD-10-CM | POA: Diagnosis not present

## 2023-11-10 NOTE — Progress Notes (Addendum)
 Brandon Fry                                          MRN: [id number]   11/10/2023   The [facility] Quality Team Specialist reviewed this patient medical record for the purposes of chart review for care gap closure. The following were reviewed: abstraction for care gap closure-glycemic status assessment. Chart reviewed for [facility] labs also  12/22/2023- chart reviewed again for [facility] and [facility] Quality Team

## 2023-12-10 ENCOUNTER — Ambulatory Visit

## 2023-12-10 DIAGNOSIS — J029 Acute pharyngitis, unspecified: Secondary | ICD-10-CM | POA: Diagnosis not present

## 2023-12-10 DIAGNOSIS — Z03818 Encounter for observation for suspected exposure to other biological agents ruled out: Secondary | ICD-10-CM | POA: Diagnosis not present

## 2023-12-10 DIAGNOSIS — B349 Viral infection, unspecified: Secondary | ICD-10-CM | POA: Diagnosis not present
